# Patient Record
Sex: Male | Born: 1950 | Race: White | Hispanic: No | Marital: Married | Smoking: Former smoker
Health system: Southern US, Community
[De-identification: ages and names within clinical notes are randomized; demographics above are authoritative.]

## PROBLEM LIST (undated history)

## (undated) DIAGNOSIS — E785 Hyperlipidemia, unspecified: Secondary | ICD-10-CM

## (undated) DIAGNOSIS — I739 Peripheral vascular disease, unspecified: Secondary | ICD-10-CM

## (undated) DIAGNOSIS — M4802 Spinal stenosis, cervical region: Secondary | ICD-10-CM

## (undated) DIAGNOSIS — G709 Myoneural disorder, unspecified: Secondary | ICD-10-CM

## (undated) DIAGNOSIS — M549 Dorsalgia, unspecified: Secondary | ICD-10-CM

## (undated) DIAGNOSIS — R7301 Impaired fasting glucose: Secondary | ICD-10-CM

## (undated) DIAGNOSIS — I10 Essential (primary) hypertension: Secondary | ICD-10-CM

## (undated) DIAGNOSIS — R05 Cough: Secondary | ICD-10-CM

## (undated) DIAGNOSIS — G4733 Obstructive sleep apnea (adult) (pediatric): Secondary | ICD-10-CM

## (undated) DIAGNOSIS — C801 Malignant (primary) neoplasm, unspecified: Secondary | ICD-10-CM

## (undated) DIAGNOSIS — R972 Elevated prostate specific antigen [PSA]: Secondary | ICD-10-CM

## (undated) DIAGNOSIS — R059 Cough, unspecified: Secondary | ICD-10-CM

## (undated) DIAGNOSIS — M1990 Unspecified osteoarthritis, unspecified site: Secondary | ICD-10-CM

## (undated) DIAGNOSIS — F329 Major depressive disorder, single episode, unspecified: Secondary | ICD-10-CM

## (undated) DIAGNOSIS — E119 Type 2 diabetes mellitus without complications: Secondary | ICD-10-CM

## (undated) DIAGNOSIS — F32A Depression, unspecified: Secondary | ICD-10-CM

## (undated) DIAGNOSIS — J310 Chronic rhinitis: Secondary | ICD-10-CM

## (undated) HISTORY — DX: Cough, unspecified: R05.9

## (undated) HISTORY — DX: Cough: R05

## (undated) HISTORY — DX: Obstructive sleep apnea (adult) (pediatric): G47.33

## (undated) HISTORY — DX: Depression, unspecified: F32.A

## (undated) HISTORY — DX: Spinal stenosis, cervical region: M48.02

## (undated) HISTORY — DX: Impaired fasting glucose: R73.01

## (undated) HISTORY — PX: OTHER SURGICAL HISTORY: SHX169

## (undated) HISTORY — DX: Essential (primary) hypertension: I10

## (undated) HISTORY — DX: Hyperlipidemia, unspecified: E78.5

## (undated) HISTORY — PX: PROSTATE BIOPSY: SHX241

## (undated) HISTORY — DX: Chronic rhinitis: J31.0

## (undated) HISTORY — DX: Dorsalgia, unspecified: M54.9

## (undated) HISTORY — DX: Major depressive disorder, single episode, unspecified: F32.9

---
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
## 2004-10-16 DIAGNOSIS — G4733 Obstructive sleep apnea (adult) (pediatric): Secondary | ICD-10-CM

## 2004-10-16 HISTORY — DX: Obstructive sleep apnea (adult) (pediatric): G47.33

## 2004-12-20 ENCOUNTER — Ambulatory Visit (HOSPITAL_COMMUNITY): Admission: RE | Admit: 2004-12-20 | Discharge: 2004-12-20 | Payer: Self-pay | Admitting: Chiropractic Medicine

## 2007-10-17 DIAGNOSIS — M549 Dorsalgia, unspecified: Secondary | ICD-10-CM

## 2007-10-17 HISTORY — DX: Dorsalgia, unspecified: M54.9

## 2008-02-03 ENCOUNTER — Ambulatory Visit (HOSPITAL_COMMUNITY): Admission: RE | Admit: 2008-02-03 | Discharge: 2008-02-03 | Payer: Self-pay | Admitting: Chiropractic Medicine

## 2008-10-16 HISTORY — PX: OTHER SURGICAL HISTORY: SHX169

## 2009-02-12 ENCOUNTER — Ambulatory Visit (HOSPITAL_COMMUNITY): Admission: RE | Admit: 2009-02-12 | Discharge: 2009-02-12 | Payer: Self-pay | Admitting: Neurosurgery

## 2009-03-03 ENCOUNTER — Ambulatory Visit (HOSPITAL_COMMUNITY): Admission: RE | Admit: 2009-03-03 | Discharge: 2009-03-05 | Payer: Self-pay | Admitting: Neurosurgery

## 2010-01-31 ENCOUNTER — Encounter: Payer: Self-pay | Admitting: Cardiology

## 2010-02-02 ENCOUNTER — Ambulatory Visit: Payer: Self-pay | Admitting: Cardiology

## 2010-02-02 DIAGNOSIS — R079 Chest pain, unspecified: Secondary | ICD-10-CM | POA: Insufficient documentation

## 2010-02-16 ENCOUNTER — Encounter: Payer: Self-pay | Admitting: Cardiology

## 2010-02-22 ENCOUNTER — Telehealth (INDEPENDENT_AMBULATORY_CARE_PROVIDER_SITE_OTHER): Payer: Self-pay | Admitting: *Deleted

## 2010-02-23 ENCOUNTER — Ambulatory Visit: Payer: Self-pay | Admitting: Cardiology

## 2010-02-23 ENCOUNTER — Ambulatory Visit: Payer: Self-pay

## 2010-02-23 ENCOUNTER — Encounter (HOSPITAL_COMMUNITY): Admission: RE | Admit: 2010-02-23 | Discharge: 2010-04-15 | Payer: Self-pay | Admitting: Cardiology

## 2010-04-02 IMAGING — CT CT CERVICAL SPINE W/ CM
3 of 14 series · 9 of 33 positions shown, 10 images · IV contrast (omnipaque)
Comparison: None

CLINICAL DATA: Low back pain.  Leg pain.  Neck pain.  Arm pain.

MYELOGRAM CERVICAL AND LUMBAR
TECHNIQUE: Lumbar puncture was performed by Dr. Bac. Following
injection of intrathecal Omnipaque contrast, spine imaging in
multiple projections was performed using fluoroscopy.
Fluoroscopy Time: 2.9 minutes.
TECHNIQUE: CT imaging of the lumbar spine was performed after
intrathecal contrast administration.  Multiplanar CT image
reconstructions were also generated.
TECHNIQUE: CT imaging of the cervical spine was performed after
intrathecal contrast administration. Multiplanar CT image

[Series 5: 2mm axial soft tissue · axial · 0.29mm/px · z∈[-700,-444]mm · 3 of 128 slices shown, 4 images]
[im 1/128  soft-tissue]
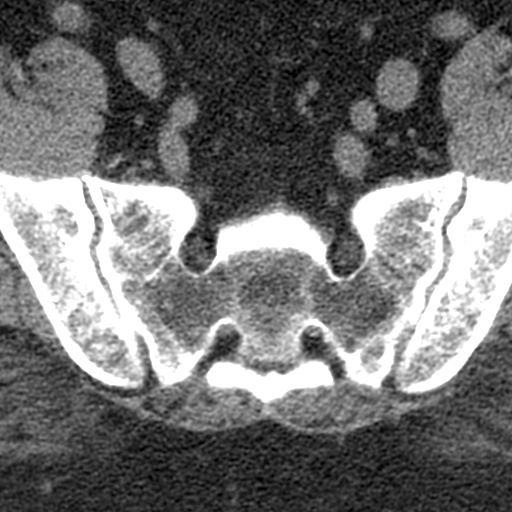
[im 1/128  bone]
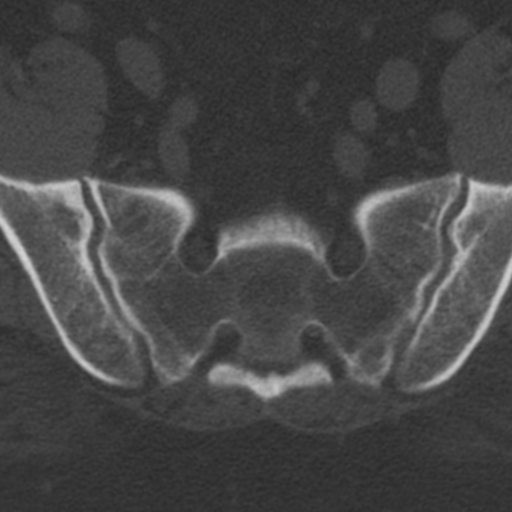
[im 64/128  bone]
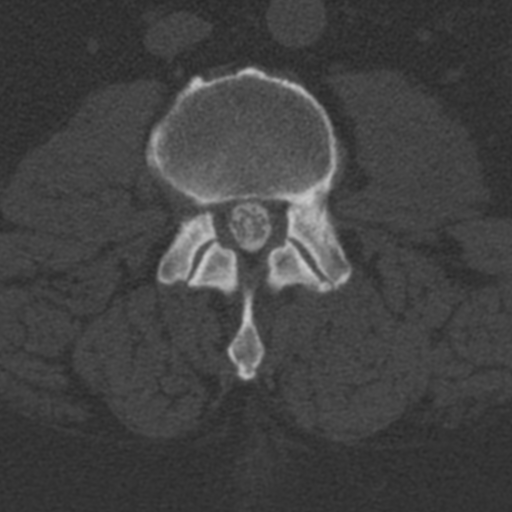
[im 128/128  bone]
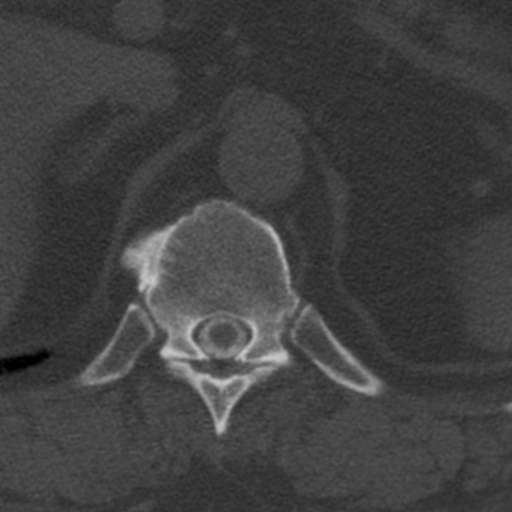

[Series 602: cor det c · coronal · 0.38mm/px · 1 of 59 slices shown]
[im 30/59  bone]
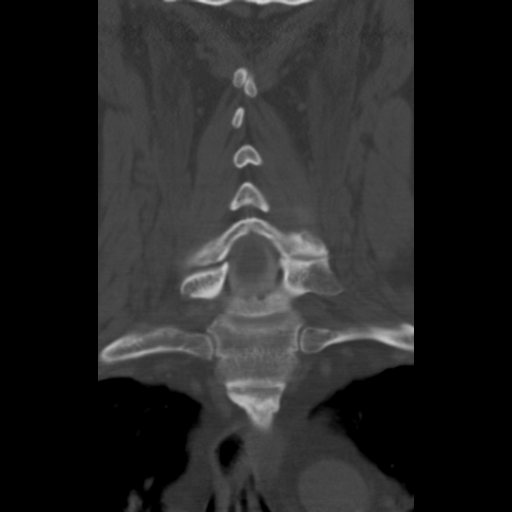

[Series 610: sag det l · sagittal · 0.50mm/px · 5 of 63 slices shown]
[im 11/63  bone]
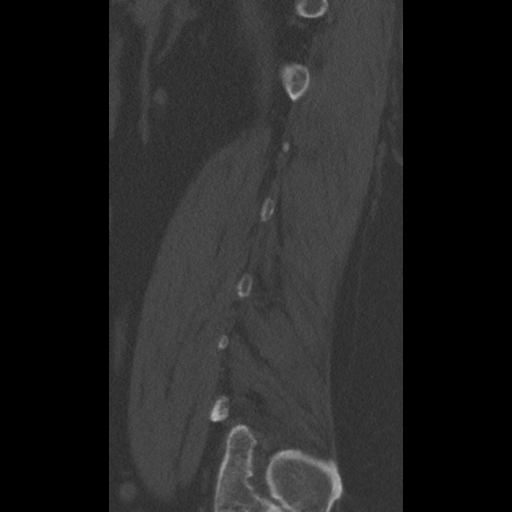
[im 21/63  bone]
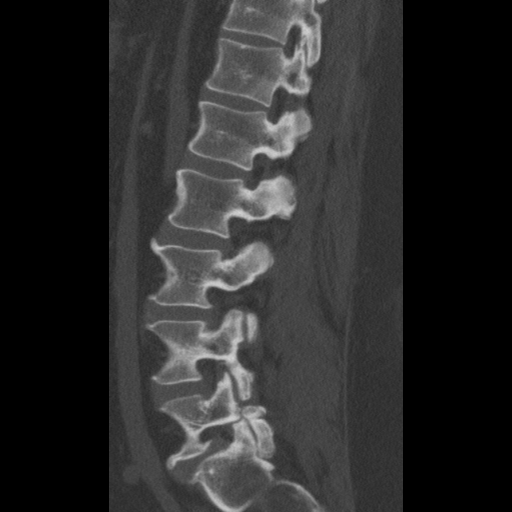
[im 32/63  bone]
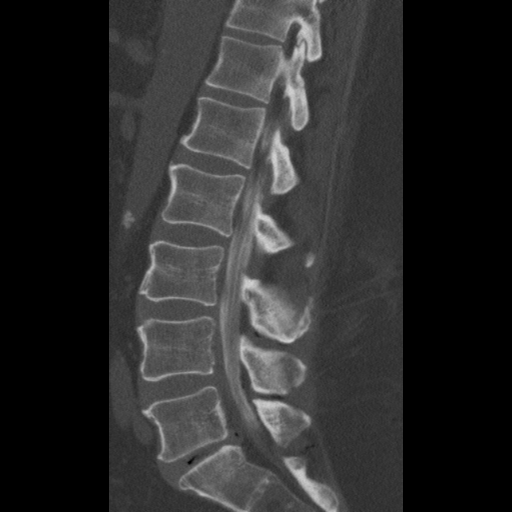
[im 42/63  bone]
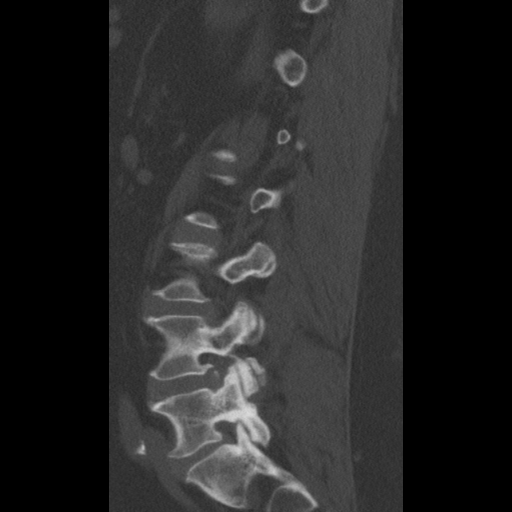
[im 52/63  bone]
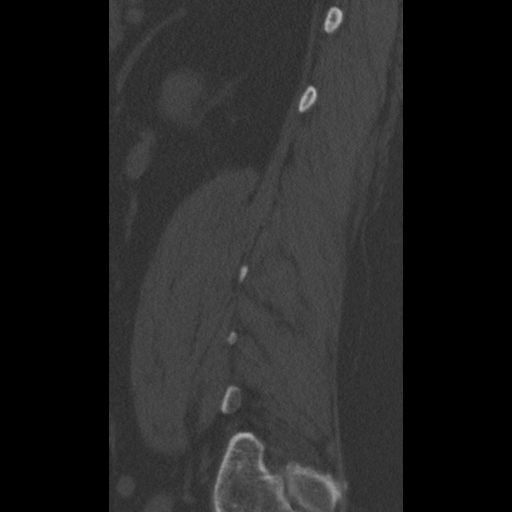

[9 of 33 positions shown; findings below may reference images not displayed]

FINDINGS: In the lumbar region, the thecal sac is somewhat
diminutive throughout.  There are small anterior extradural defects
at L4-5 and L5-S1 but no distinct focal neural compression
initially demonstrated.  On one of the delayed images, there
appears to be narrowing of the left lateral recess at L4-5.

In the cervical region, contrast opacity is limited.  Initially,
there is relative block to the passage of contrast at the C5-6
level, but with flexion of the head contrast dissipated rapidly.
See results of CT scan below.
IMPRESSION: Some narrowing of the left lateral recess at L4-5.

Degree of spinal stenosis at C5-6.  See below.

CT MYELOGRAPHY LUMBAR SPINE
FINDINGS: T11-12 through L2-3:  Normal.  No disc pathology.  No
canal and foraminal stenosis.

L3-4:  Mild bulging of the disc.  Mild facet and ligamentous
prominence.  No apparent compressive stenosis.

L4-5:  There is a left posterolateral to foraminal disc herniation.
There is facet arthropathy worse on the left than the right.  There
is stenosis of the intervertebral foramen on the left quite likely
to affect the L4 nerve root.  There is lateral recess narrowing
bilaterally, left more than right, that could effect either L5
nerve root, more likely the left.

L5-S1:  There is a unilateral pars defect on the right to.  There
is pars sclerosis on the left but no defect.  There is
anterolisthesis of 1 mm.  There is shallow broad-based protrusion
of disc material but no apparent effect upon the S1 nerve roots or
thecal sac.  There is mild foraminal narrowing, right more than
left.
IMPRESSION: L4-5:  Broad-based, left posterolateral predominant disc
herniation.  Facet arthropathy left worse than right.  Stenosis of
the foramen on the left that would seem likely compress the L4
nerve root.  Narrowing of the lateral recesses that could effect
either L5 nerve root, more likely the left.

L5-S1:  Unilateral pars defect on the right with 1 mm of
anterolisthesis.  Shallow broad-based disc protrusion.  Foraminal
narrowing, right more than left, could effect the L5 nerve roots.

CT MYELOGRAPHY CERVICAL SPINE
FINDINGS: The foramen magnum and C1-2 appear widely patent.

C2-3:  No disc pathology.  The canal and foramina appear widely
patent.  There is a small articulation between the spinous
processes that is probably not significant.

C3-4:  Minimal uncovertebral prominence and facet degeneration on
the left.  The canal is small but there is ample subarachnoid space
surrounding the cord.  Mild foraminal narrowing on the left could
effect the C4 nerve root.

C4-5:  Spondylosis with endplate osteophytes and shallow herniation
of disc material.  The ventral subarachnoid space is effaced.  The
cord is indented.  There is foraminal stenosis bilaterally, left
worse than right.  The facet on the left shows mild degeneration.

C5-6:  Spondylosis with endplate osteophytes that efface the
subarachnoid space and deform the cord.  Bilateral neural foraminal
stenosis.

C6-7:  Mild bulging of the disc.  The canal sufficiently patent.
No foraminal compromise.

C7-T1:  Detail is limited because of shoulder density.  The canal
appears widely patent.  No foraminal stenosis.
IMPRESSION: C4-5:  Spinal stenosis with endplate osteophytes and broad-based
herniation of disc material.  The ventral subarachnoid space is
effaced and the cord is deformed.  There is neural foraminal
stenosis, left worse than right.

C5-6:  Spinal stenosis because of end plate osteophytes.  The
subarachnoid space is effaced.  The cord is flattened.  There is
neural foraminal stenosis bilaterally.

## 2010-11-17 NOTE — Assessment & Plan Note (Signed)
Summary: Cardiology Nuclear Study  Nuclear Med Background Indications for Stress Test: Evaluation for Ischemia    History Comments: NO DOCUMENTED CAD; h/o OSA  Symptoms: Chest Tightness, DOE  Symptoms Comments: Tingling to the (L) arm with chest tightness. Last episode of CP:1 week ago.   Nuclear Pre-Procedure Cardiac Risk Factors: Family History - CAD, History of Smoking Caffeine/Decaff Intake: None NPO After: 7:00 PM Lungs: Clear.  O2 Sat 98% on RA. IV 0.9% NS with Angio Cath: 20g     IV Site: (L) AC IV Started by: Irean Hong RN Chest Size (in) 48     Height (in): 70.5 Weight (lb): 252 BMI: 35.78 Tech Comments: The patient has pinched nerve in back with tingling and numbness in feet, and (L) knee pain, unable to walk fast on treadmill per patient; changed to lexiscan myoview. Patsy Edwards,RN.  Nuclear Med Study 1 or 2 day study:  1 day     Stress Test Type:  Eugenie Birks Reading MD:  Willa Rough, MD     Referring MD:  Marca Ancona, MD Resting Radionuclide:  Technetium 58m Tetrofosmin     Resting Radionuclide Dose:  11 mCi  Stress Radionuclide:  Technetium 73m Tetrofosmin     Stress Radionuclide Dose:  33 mCi   Stress Protocol   Lexiscan: 0.4 mg   Stress Test Technologist:  [employer]a [employer] CMA-N     Nuclear Technologist:  Domenic Polite CNMT  Rest Procedure  Myocardial perfusion imaging was performed at rest 45 minutes following the intravenous administration of Myoview Technetium 74m Tetrofosmin.  Stress Procedure  The patient received IV Lexiscan 0.4 mg over 15-seconds.  Myoview injected at 30-seconds.  There were no significant changes with lexiscan.  He did c/o chest tightness.  Quantitative spect images were obtained after a 45 minute delay.  QPS Raw Data Images:  Normal; no motion artifact; normal heart/lung ratio. Stress Images:  There is normal uptake in all areas. Rest Images:  Normal homogeneous uptake in all areas of the myocardium. Subtraction (SDS):  No  evidence of ischemia. Transient Ischemic Dilatation:  .95  (Normal <1.22)  Lung/Heart Ratio:  .32  (Normal <0.45)  Quantitative Gated Spect Images QGS EDV:  93 ml QGS ESV:  36 ml QGS EF:  62 % QGS cine images:  Normal motion  Findings Normal nuclear study      Overall Impression  Exercise Capacity: Lexiscan BP Response: Normal blood pressure response. Clinical Symptoms: SOB ECG Impression: No significant ST segment change suggestive of ischemia. Overall Impression: Normal stress nuclear study.  Appended Document: Cardiology Nuclear Study normal study  Appended Document: Cardiology Nuclear Study discussed results with pt by telephone

## 2010-11-17 NOTE — Assessment & Plan Note (Signed)
Summary: np6/chest tightness/ cough/ sob/jml   Primary Provider:  Dr. Eloise Harman  CC:  chest pain and cough and some Headaches off and on.  .  History of Present Illness: 60 yo presents for evaluation of chest pain.  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  This past "Sunday, he woke up with a sinus-type headache.  After supper on Sunday (ate a pimento cheese sandwich), he developed tightness in his chest with tingling in his left arm.  This made him very anxious.  He laid down on the couch and the tightness resolved in about 1/2 hour.  On Monday morning, he woke up wtih milder chest tightness.  On Monday, this was associated with cough, congestion, and post-nasal drip and resolved during the morning.  No chest tightness since that time.  He had had no prior chest tightness.  CXR at Dr. Paterson's office was negative.  No cardiac history or stress test.  Father had an MI at 62.  BP is ok today and patient says that his lipids have been ok.  Patient still has a nagging cough.  He was seen at Dr. Paterson's office with the most likely diagnosis thought to be allergies.  He was referred here for cardiac evaluation.    ECG: NSR, possible left atrial enlargement, right axis deviation  Current Medications (verified): 1)  Allegra 180 Mg Tabs (Fexofenadine Hcl) .... Once Daily For Allergies 2)  Ecotrin 325 Mg Tbec (Aspirin) .... Take One Tablet Once Daily--Pt Has Not Started This Yet  Allergies (verified): No Known Drug Allergies  Past History:  Past Medical History: 1. 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 Neuropathy 5. Mild obesity  Family History: Father with MI at 62  Social History: Quit smoking in 1981. Works as an accounts manager (stressful job).   Review of Systems       All systems reviewed and negative except as per HPI.   Vital Signs:  Patient profile:   59 year old male Height:      71  inches Weight:      253 pounds BMI:     35" .41 Pulse rate:   62 / minute Pulse rhythm:   regular BP sitting:   136 / 80  (left arm) Cuff size:   large  Vitals Entered By: Judithe Modest CMA (February 02, 2010 2:27 PM)  Physical Exam  General:  Well developed, well nourished, in no acute distress. Mildly obese.  Head:  normocephalic and atraumatic Nose:  no deformity, discharge, inflammation, or lesions Mouth:  Teeth, gums and palate normal. Oral mucosa normal. Neck:  Neck supple, no JVD. No masses, thyromegaly or abnormal cervical nodes. Lungs:  Clear bilaterally to auscultation and percussion. Heart:  Non-displaced PMI, chest non-tender; regular rate and rhythm, S1, S2 without murmurs, rubs or gallops. Carotid upstroke normal, no bruit.  Pedals normal pulses. No edema, no varicosities. Abdomen:  Bowel sounds positive; abdomen soft and non-tender without masses, organomegaly, or hernias noted. No hepatosplenomegaly. Msk:  Back normal, normal gait. Muscle strength and tone normal. Extremities:  No clubbing or cyanosis. Neurologic:  Alert and oriented x 3. Skin:  Intact without lesions or rashes. Psych:  Normal affect.   Impression & Recommendations:  Problem # 1:  CHEST PAIN (ICD-786.50) Prolonged but atypical chest pain. It is possible that the episode after dinner was related to GERD.  Alternatively, he could have an acute bronchitis with cough and even some  bronchospasm explaining his symptoms.  He is anxious as his father had an MI at 78 (he is 61). He has not had a cardiac workup in the past.  Given the significant symptoms and family history, I think that risk stratification with an ETT-myoview would be reasonable.  He should continue aspirin.   Other Orders: EKG w/ Interpretation (93000) Nuclear Stress Test (Nuc Stress Test)  Patient Instructions: 1)  Your physician has requested that you have an exercise stress myoview.  For further information please visit  [email or web address]  Please follow instruction sheet, as given. 2)  Your physician recommends that you schedule a follow-up appointment in:  as needed if testing is normal.

## 2010-11-17 NOTE — Progress Notes (Signed)
Summary: Nuclear Pre-Procedure  Phone Note Outgoing Call   Call placed by: Milana Na, EMT-P,  Feb 22, 2010 1:32 PM Summary of Call: Left message with information on Myoview Information Sheet (see scanned document for details).      Nuclear Med Background Indications for Stress Test: Evaluation for Ischemia     Symptoms: Chest Pain, Chest Tightness  Symptoms Comments: Tingling to the (L) arm with chest tightness   Nuclear Pre-Procedure Cardiac Risk Factors: History of Smoking Height (in): 71  Nuclear Med Study Referring MD:  D.McLean

## 2010-11-17 NOTE — Cardiovascular Report (Signed)
Summary: Outpatient Coinsurance Notice   Outpatient Coinsurance Notice   Imported By: Roderic Ovens 02/28/2010 12:39:01  _____________________________________________________________________  External Attachment:    Type:   Image     Comment:   External Document

## 2010-11-17 NOTE — Consult Note (Signed)
Summary: [facility]   Imported By: Marylou Mccoy 03/30/2010 15:12:48  _____________________________________________________________________  External Attachment:    Type:   Image     Comment:   External Document

## 2010-11-18 NOTE — Letter (Signed)
Summary: Company secretary   Imported By: Roderic Ovens 03/25/2010 12:10:59  _____________________________________________________________________  External Attachment:    Type:   Image     Comment:   External Document

## 2011-01-24 LAB — CBC
HCT: 43.7 % (ref 39.0–52.0)
MCHC: 34.6 g/dL (ref 30.0–36.0)
MCV: 85.1 fL (ref 78.0–100.0)
Platelets: 248 10*3/uL (ref 150–400)

## 2011-02-28 NOTE — H&P (Signed)
Jeffrey Banks, Jeffrey Banks                  ACCOUNT NO.:  [id number]   MEDICAL RECORD NO.:  [id number]          PATIENT TYPE:  OIB   LOCATION:  3012                         FACILITY:  [facility]   PHYSICIAN:  Hilda Lias, M.D.   DATE OF BIRTH:  [birth year]   DATE OF ADMISSION:  03/03/2009  DATE OF DISCHARGE:                              HISTORY & PHYSICAL   HISTORY OF PRESENT ILLNESS:  Jeffrey Banks is a gentleman who had been  complaining of neck pain with radiation to both upper extremity.  He  fell back from his lawn tractor.  At the time, a year ago, he was able  to move.  He was having burning sensation in the upper and lower  extremity.  He was seen by me.  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  He was advised surgery but he decided  to wait longer.  He knew about the risks.   PAST MEDICAL HISTORY:  Tonsillectomy.   FAMILY HISTORY:  Negative.   SOCIAL HISTORY:  Negative.   REVIEW OF SYSTEMS:  Positive burning sensation in both legs.   PHYSICAL EXAMINATION:  VITAL SIGNS:  He is at 5 feet 10 inches and 250  pounds.  HEAD, EARS, NOSE, AND THROAT:  Normal.  NECK:  He is able to flex __________ discomfort onto both shoulders.  LUNGS:  Clear.  HEART:  Normal.  ABDOMEN:  Normal.  EXTREMITIES:  Normal pulses.  NEURO:  He has weakness of the biceps and the deltoid.  He has  hyperreflexia with Babinski on the right side.  He has a difficulty  walking straight line.   IMAGING:  Cervical spine x-ray shows stenosis at level of C4-C5 and C5-  C6.  There seems to be some changes in the spinal cord itself.   IMPRESSION:  1. C4-C5, C5-C6 stenosis with cervical myelopathy.  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  3. Morbid obesity.   RECOMMENDATIONS:  The patient is being admitted for surgery.  The  procedure will be a two-level anterior cervical diskectomy.  He knows  about the risks such as infection, CSF leak, worsening of pain, no  improvement whatsoever,  paralysis, and need for further surgery.           ______________________________  Hilda Lias, M.D.     EB/MEDQ  D:  03/03/2009  T:  03/03/2009  Job:  [id number]

## 2011-02-28 NOTE — Op Note (Signed)
Jeffrey Banks, Jeffrey Banks                  ACCOUNT NO.:  [id number]   MEDICAL RECORD NO.:  [id number]          PATIENT TYPE:  OIB   LOCATION:  3012                         FACILITY:  [facility]   PHYSICIAN:  Hilda Lias, M.D.   DATE OF BIRTH:  01/12/1951   DATE OF PROCEDURE:  03/03/2009  DATE OF DISCHARGE:                               OPERATIVE REPORT   PREOPERATIVE DIAGNOSIS:  C4-C5 and C5-C6 stenosis with cervical  myelopathy and morbid obesity.   POSTOPERATIVE DIAGNOSES:  C4-C5 and C5-C6 stenosis with cervical  myelopathy and morbid obesity.   PROCEDURE:  C4-5 and C5-6 diskectomy, decompression of spinal cord,  interbody fusion with allograft, autograft plate, and microscope.   SURGEON:  Hilda Lias, MD   ASSISTANT:  Danae Orleans. Venetia Maxon, MD   CLINICAL HISTORY:  Mr. Featherston is a gentleman who a year ago, fell and he  was paralyzed for several minutes.  He was seen in my office and he was  advised surgery.  He declined and he came back almost a year later  complaining of worse in the pain with weakness prone with balance.  X-  rays show severe stenosis at the level of C4-5 and C5-6.  Surgery was  advised.  The risks were explained to him including the possibility of  all the complications secondary to his morbid obesity.  He has a history  of sleep apnea.   PROCEDURE:  The patient was taken to the OR and after intubation, the  left side of the neck was cleaned with DuraPrep.  No hyperextension of  the neck was done and the surgery was done in a neutral position because  he has a stenosis.  Then, a transverse incision was made through the  skin through a thick adipose tissue and platysma down to cervical area.  The patient had 2 areas of large osteophyte and x-rays showed the upper  needle was at the level of C4-5.  Then with the drill, we removed the  osteophyte at the level of C4-5 and C5-6.  We started at the level of C5-  6, that area was quite stenotic up to the point that the disk  itself was  half calcified.  We had to remove the disk using the drill.  Finally, we  ended up in the posterior ligament, which was calcified.  An incision  was made and decompression of the spinal cord as well as the nerve was  achieved.  We had good decompression at that area.  At the level of C4-  5, we had the same finding.  Again, in the end we had good decompression  of that area.  During procedure, we were thinking about doing  corpectomy, but we were able to achieve good decompression at both  levels.  Then, an allograft of 7 mm with autograft inside was inserted  between L4-5 at about 9-mm between L5-6.  Then a plate using 6 screw was  done.  The x-ray showed the upper plate as well as the bone graft was in  good position.  We were unable to see  below.  Although, we achieved good  decompression, because of his obesity, we decided to leave a drain in  the pre-cervical area.  The wound was closed with Vicryl and Steri-  Strips.           ______________________________  Hilda Lias, M.D.     EB/MEDQ  D:  03/03/2009  T:  03/04/2009  Job:  [id number]

## 2012-10-08 ENCOUNTER — Other Ambulatory Visit (HOSPITAL_COMMUNITY): Payer: Self-pay | Admitting: Family Medicine

## 2012-10-08 ENCOUNTER — Ambulatory Visit (HOSPITAL_COMMUNITY)
Admission: RE | Admit: 2012-10-08 | Discharge: 2012-10-08 | Disposition: A | Discharge status: Home / Self Care | Payer: BC Managed Care – PPO | Source: Ambulatory Visit | Attending: Family Medicine | Admitting: Family Medicine

## 2012-10-08 DIAGNOSIS — S0093XA Contusion of unspecified part of head, initial encounter: Secondary | ICD-10-CM

## 2012-10-08 DIAGNOSIS — S0083XA Contusion of other part of head, initial encounter: Secondary | ICD-10-CM | POA: Insufficient documentation

## 2012-10-08 DIAGNOSIS — S0003XA Contusion of scalp, initial encounter: Secondary | ICD-10-CM | POA: Insufficient documentation

## 2012-10-08 DIAGNOSIS — R42 Dizziness and giddiness: Secondary | ICD-10-CM

## 2012-10-08 DIAGNOSIS — X58XXXA Exposure to other specified factors, initial encounter: Secondary | ICD-10-CM | POA: Insufficient documentation

## 2012-10-16 DIAGNOSIS — R7301 Impaired fasting glucose: Secondary | ICD-10-CM

## 2012-10-16 HISTORY — DX: Impaired fasting glucose: R73.01

## 2014-10-05 ENCOUNTER — Encounter: Payer: Self-pay | Admitting: Neurology

## 2014-10-05 ENCOUNTER — Ambulatory Visit (INDEPENDENT_AMBULATORY_CARE_PROVIDER_SITE_OTHER): Payer: 59 | Admitting: Neurology

## 2014-10-05 VITALS — BP 137/70 | HR 69 | Temp 98.4°F | Ht 70.0 in | Wt 252.0 lb

## 2014-10-05 DIAGNOSIS — Z9989 Dependence on other enabling machines and devices: Secondary | ICD-10-CM

## 2014-10-05 DIAGNOSIS — G4733 Obstructive sleep apnea (adult) (pediatric): Secondary | ICD-10-CM

## 2014-10-05 DIAGNOSIS — E669 Obesity, unspecified: Secondary | ICD-10-CM

## 2014-10-05 NOTE — Patient Instructions (Addendum)
If you have new symptoms with your sleep apnea, such as recurrence of snoring, sleepiness in the daytime or new morning headaches, or if your CPAP is defective, we will do another sleep study as your last study is nearly 65 years old.  I will see you back as needed.   Please continue using your CPAP regularly. While your insurance requires that you use CPAP at least 4 hours each night on 70% of the nights, I recommend, that you not skip any nights and use it throughout the night if you can. Getting used to CPAP and staying with the treatment long term does take time and patience and discipline. Untreated obstructive sleep apnea when it is moderate to severe can have an adverse impact on cardiovascular health and raise her risk for heart disease, arrhythmias, hypertension, congestive heart failure, stroke and diabetes. Untreated obstructive sleep apnea causes sleep disruption, nonrestorative sleep, and sleep deprivation. This can have an impact on your day to day functioning and cause daytime sleepiness and impairment of cognitive function, memory loss, mood disturbance, and problems focussing. Using CPAP regularly can improve these symptoms.

## 2014-10-05 NOTE — Progress Notes (Signed)
Subjective:    Patient ID: Jeffrey Banks is a 63 y.o. male.  HPI     Star Age, MD, PhD [facility] [address] [place], [place] [address]  Dear Dr. Philip Aspen,    I saw your patient, Jeffrey Banks, upon your kind request in my neurologic clinic today for initial consultation of his sleep disorder, in particular evaluation of his prior diagnosis of OSA. The patient in unaccompanied today. As you know, Jeffrey Banks is a 63 year old right-handed gentleman with an underlying medical history of hyperlipidemia, rhinitis, depression, low back pain, hypertension, impaired fasting glucose, and obesity, who has a diagnosis of severe obstructive sleep apnea since 2006. He has been on CPAP therapy at 9 cm of water pressure and reports compliance with the machine. However, his equipment is almost 65 years old and he has never been reevaluated since his original diagnosis. Of note, he had a split-night sleep study at the [facility] on 03/23/2005 and I reviewed the report. Sleep efficiency was 59% for the baseline portion of the sleep study. His AHI was 66 per hour. Average oxygen saturation was 94%, nadir was 87%. He was observed to snore loudly. He was titrated on CPAP. He was at the time not tolerant of CPAP above a pressure of 4 cm. It was recommended that he start AutoPap therapy at home.  I was able to review compliance data and the patient has been compliant with treatment. His residual AHI fluctuates quite a bit however. It averages a little bit above 5 per hour. Leak at times can be high. Of note, he has a full beard. He uses nasal pillows and is satisfied with him. He gets his supplies online. His machine is almost 65 years old but works fine for him. He's indicating full compliance and good results since he started using CPAP. He has no particular sleep related complaints. His bedtime is 10 PM and he falls asleep fairly quickly. His wake  time is 5 AM. On Sundays he takes in. He has no morning headaches or nocturia. He denies restless leg symptoms. He is not known to twitch in his sleep. His weight is about the same since his sleep study some 9 years ago. He has no family history of obstructive sleep apnea. He drinks about 3 cups of coffee per day and no sodas. He quit smoking over 30 years ago and does not drink any alcohol. He had neck surgery some 7 years ago and will require left knee replacement surgery soon.  His Past Medical History Is Significant For: Past Medical History  Diagnosis Date  . Rhinitis   . Cough   . Depression   . OSA (obstructive sleep apnea) 2006    on CPAP,at 9cm  . Hyperlipidemia   . Hypertension   . Impaired fasting glucose 2014  . Stenosis of cervical spine   . Back pain 2009    His Past Surgical History Is Significant For: Past Surgical History  Procedure Laterality Date  . Lamenectomy  2009  . Tonsillectomy  1970    His Family History Is Significant For: Family History  Problem Relation Age of Onset  . Heart failure Father   . High Cholesterol Mother     His Social History Is Significant For: History   Social History  . Marital Status: Married    Spouse Name: Jeffrey Banks    Number of Children: 2  . Years of Education: college  Occupational History  .      [employer]   Social History Main Topics  . Smoking status: Never Smoker   . Smokeless tobacco: Never Used  . Alcohol Use: No  . Drug Use: No  . Sexual Activity: None   Other Topics Concern  . None   Social History Narrative   Consumes 2-3 cups of caffeine daily    His Allergies Are:  No Known Allergies:   His Current Medications Are:  Outpatient Encounter Prescriptions as of 10/05/2014  Medication Sig  . Fexofenadine HCl (ALLEGRA PO) Take by mouth daily.  . [DISCONTINUED] HYDROcodone-acetaminophen (NORCO/VICODIN) 5-325 MG per tablet   :  Review of Systems:  Out of a complete 14 point review of  systems, all are reviewed and negative with the exception of these symptoms as listed below:   Review of Systems  Allergic/Immunologic: Positive for environmental allergies.  Neurological:       Snoring    Objective:  Neurologic Exam  Physical Exam Physical Examination:   Filed Vitals:   10/05/14 0808  BP: 137/70  Pulse: 69  Temp: 98.4 F (36.9 C)    General Examination: The patient is a very pleasant 63 y.o. male in no acute distress. He appears well-developed and well-nourished and very well groomed.   HEENT: Normocephalic, atraumatic, pupils are equal, round and reactive to light and accommodation. Funduscopic exam is normal with sharp disc margins noted. Extraocular tracking is good without limitation to gaze excursion or nystagmus noted. Normal smooth pursuit is noted. Hearing is grossly intact. Tympanic membranes are clear bilaterally. Face is symmetric with normal facial animation and normal facial sensation. Speech is clear with no dysarthria noted. There is no hypophonia. There is no lip, neck/head, jaw or voice tremor. Neck is supple with full range of passive and active motion. There are no carotid bruits on auscultation. Oropharynx exam reveals: mild mouth dryness, good dental hygiene and mild airway crowding, due to narrow airway entry and slightly redundant soft palate. Mallampati is class I. Tongue protrudes centrally and palate elevates symmetrically. Tonsils are absent is 19 and 1/8 inches.  Chest: Clear to auscultation without wheezing, rhonchi or crackles noted.  Heart: S1+S2+0, regular and normal without murmurs, rubs or gallops noted.   Abdomen: Soft, non-tender and non-distended with normal bowel sounds appreciated on auscultation.  Extremities: There is no pitting edema in the distal lower extremities bilaterally. Pedal pulses are intact.  Skin: Warm and dry without trophic changes noted. There are no varicose veins.  Musculoskeletal: exam reveals no obvious  joint deformities, tenderness or joint swelling or erythema, with the exception of left knee tenderness.  Neurologically:  Mental status: The patient is awake, alert and oriented in all 4 spheres. His immediate and remote memory, attention, language skills and fund of knowledge are appropriate. There is no evidence of aphasia, agnosia, apraxia or anomia. Speech is clear with normal prosody and enunciation. Thought process is linear. Mood is normal and affect is normal.  Cranial nerves II - XII are as described above under HEENT exam. In addition: shoulder shrug is normal with equal shoulder height noted. Motor exam: Normal bulk, strength and tone is noted. There is no drift, tremor or rebound. Romberg is negative. Reflexes are 2+ throughout. Babinski: Toes are flexor bilaterally. Fine motor skills and coordination: intact with normal finger taps, normal hand movements, normal rapid alternating patting, normal foot taps and normal foot agility.  Cerebellar testing: No dysmetria or intention tremor on finger to  nose testing. Heel to shin is unremarkable bilaterally. There is no truncal or gait ataxia.  Sensory exam: intact to light touch, pinprick, vibration, temperature sense in the upper and lower extremities.  Gait, station and balance: He stands easily. No veering to one side is noted. No leaning to one side is noted. Posture is age-appropriate and stance is narrow based. Gait shows normal stride length and normal pace. No problems turning are noted. He turns en bloc. Tandem walk is difficult for him secondary to left knee pain.   Assessment and Plan:   In summary, Jeffrey Banks is a very pleasant 63 y.o.-year old male with an underlying medical history of hyperlipidemia, rhinitis, depression, low back pain, hypertension, impaired fasting glucose, and obesity, who has a diagnosis of severe obstructive sleep apnea since 2006.  he has been compliant with treatment. He has an older machine but it has been  working well for him. He has no new sleep related complaints. His physical exam is nonfocal. He is advised to try to continue to work on weight loss. His weight was about the same when he had his sleep study in 2006. I did review limited compliance data that we were able to get off of his machine. He certainly does appear to be fully compliant. Leak and AHI seems to fluctuate. This may be in part secondary to his full beard. He has been using nasal pillows and gets his supplies online. Today I suggested that we consider a new sleep study if he has a margins of new sleep related complaints such as reoccurrence of snoring and daytime somnolence, or morning headaches or significant nocturia, or if his machine becomes defective. I will see him back as needed at this time.  he understands the importance of being compliant with CPAP therapy. He has taken his machine on his trips as well. He likes to go hunting with his friends and has always taken his machine.  I answered all his questions today and the patient was in agreement.   Thank you very much for allowing me to participate in the care of this nice patient. If I can be of any further assistance to you please do not hesitate to call me at [phone].  Sincerely,   Star Age, MD, PhD

## 2014-11-20 DIAGNOSIS — R0789 Other chest pain: Secondary | ICD-10-CM | POA: Diagnosis not present

## 2014-11-20 DIAGNOSIS — Z01810 Encounter for preprocedural cardiovascular examination: Secondary | ICD-10-CM | POA: Diagnosis not present

## 2014-12-17 DIAGNOSIS — R7301 Impaired fasting glucose: Secondary | ICD-10-CM | POA: Diagnosis not present

## 2014-12-17 DIAGNOSIS — I10 Essential (primary) hypertension: Secondary | ICD-10-CM | POA: Diagnosis not present

## 2014-12-17 DIAGNOSIS — Z6836 Body mass index (BMI) 36.0-36.9, adult: Secondary | ICD-10-CM | POA: Diagnosis not present

## 2014-12-17 DIAGNOSIS — M545 Low back pain: Secondary | ICD-10-CM | POA: Diagnosis not present

## 2014-12-17 DIAGNOSIS — E669 Obesity, unspecified: Secondary | ICD-10-CM | POA: Diagnosis not present

## 2014-12-17 DIAGNOSIS — G4733 Obstructive sleep apnea (adult) (pediatric): Secondary | ICD-10-CM | POA: Diagnosis not present

## 2014-12-17 DIAGNOSIS — E785 Hyperlipidemia, unspecified: Secondary | ICD-10-CM | POA: Diagnosis not present

## 2015-02-08 DIAGNOSIS — M1712 Unilateral primary osteoarthritis, left knee: Secondary | ICD-10-CM | POA: Diagnosis not present

## 2015-02-23 ENCOUNTER — Ambulatory Visit: Payer: Self-pay | Admitting: Orthopedic Surgery

## 2015-04-14 DIAGNOSIS — M1712 Unilateral primary osteoarthritis, left knee: Secondary | ICD-10-CM | POA: Diagnosis not present

## 2015-04-26 NOTE — Patient Instructions (Addendum)
Jeffrey Banks  04/26/2015   Your procedure is scheduled on:    05/06/2015    Report to [facility] Main  Entrance take [place]  elevators to 3rd floor to  [place] at       [place] AM.  Call this number if you have problems the morning of surgery [phone]   Remember: ONLY 1 PERSON MAY GO WITH YOU TO SHORT STAY TO GET  READY MORNING OF [place].  Do not eat food or drink liquids :After Midnight.     Take these medicines the morning of surgery with A SIP OF WATER: Allegra                                You may not have any metal on your body including hair pins and              piercings  Do not wear jewelry, , lotions, powders or perfumes, deodorant                         Men may shave face and neck.   Do not bring valuables to the hospital. [place].  Contacts, dentures or bridgework may not be worn into surgery.  Leave suitcase in the car. After surgery it may be brought to your room.       Special Instructions: coughing and deep breathing exercises, leg exercises               Please read over the following fact sheets you were given: _____________________________________________________________________             [facility] - Preparing for Surgery Before surgery, you can play an important role.  Because skin is not sterile, your skin needs to be as free of germs as possible.  You can reduce the number of germs on your skin by washing with CHG (chlorahexidine gluconate) soap before surgery.  CHG is an antiseptic cleaner which kills germs and bonds with the skin to continue killing germs even after washing. Please DO NOT use if you have an allergy to CHG or antibacterial soaps.  If your skin becomes reddened/irritated stop using the CHG and inform your nurse when you arrive at Short Stay. Do not shave (including legs and underarms) for at least 48 hours prior to the first CHG shower.  You may  shave your face/neck. Please follow these instructions carefully:  1.  Shower with CHG Soap the night before surgery and the  morning of Surgery.  2.  If you choose to wash your hair, wash your hair first as usual with your  normal  shampoo.  3.  After you shampoo, rinse your hair and body thoroughly to remove the  shampoo.                           4.  Use CHG as you would any other liquid soap.  You can apply chg directly  to the skin and wash                       Gently with a scrungie or clean washcloth.  5.  Apply the CHG Soap to your body ONLY FROM THE NECK DOWN.   Do not use on face/ open                           Wound or open sores. Avoid contact with eyes, ears mouth and genitals (private parts).                       Wash face,  Genitals (private parts) with your normal soap.             6.  Wash thoroughly, paying special attention to the area where your surgery  will be performed.  7.  Thoroughly rinse your body with warm water from the neck down.  8.  DO NOT shower/wash with your normal soap after using and rinsing off  the CHG Soap.                9.  Pat yourself dry with a clean towel.            10.  Wear clean pajamas.            11.  Place clean sheets on your bed the night of your first shower and do not  sleep with pets. Day of Surgery : Do not apply any lotions/deodorants the morning of surgery.  Please wear clean clothes to the hospital/surgery center.  FAILURE TO FOLLOW THESE INSTRUCTIONS MAY RESULT IN THE CANCELLATION OF YOUR SURGERY PATIENT SIGNATURE_________________________________  NURSE SIGNATURE__________________________________  ________________________________________________________________________  WHAT IS A BLOOD TRANSFUSION? Blood Transfusion Information  A transfusion is the replacement of blood or some of its parts. Blood is made up of multiple cells which provide different functions.  Red blood cells carry oxygen and are used for blood loss  replacement.  White blood cells fight against infection.  Platelets control bleeding.  Plasma helps clot blood.  Other blood products are available for specialized needs, such as hemophilia or other clotting disorders. BEFORE THE TRANSFUSION  Who gives blood for transfusions?   Healthy volunteers who are fully evaluated to make sure their blood is safe. This is blood bank blood. Transfusion therapy is the safest it has ever been in the practice of medicine. Before blood is taken from a donor, a complete history is taken to make sure that person has no history of diseases nor engages in risky social behavior (examples are intravenous drug use or sexual activity with multiple partners). The donor's travel history is screened to minimize risk of transmitting infections, such as malaria. The donated blood is tested for signs of infectious diseases, such as HIV and hepatitis. The blood is then tested to be sure it is compatible with you in order to minimize the chance of a transfusion reaction. If you or a relative donates blood, this is often done in anticipation of surgery and is not appropriate for emergency situations. It takes many days to process the donated blood. RISKS AND COMPLICATIONS Although transfusion therapy is very safe and saves many lives, the main dangers of transfusion include:  1. Getting an infectious disease. 2. Developing a transfusion reaction. This is an allergic reaction to something in the blood you were given. Every precaution is taken to prevent this. The decision to have a blood transfusion has been considered carefully by your caregiver before blood is given. Blood is not given unless the benefits outweigh the risks. AFTER THE TRANSFUSION  Right after receiving a  blood transfusion, you will usually feel much better and more energetic. This is especially true if your red blood cells have gotten low (anemic). The transfusion raises the level of the red blood cells which  carry oxygen, and this usually causes an energy increase.  The nurse administering the transfusion will monitor you carefully for complications. HOME CARE INSTRUCTIONS  No special instructions are needed after a transfusion. You may find your energy is better. Speak with your caregiver about any limitations on activity for underlying diseases you may have. SEEK MEDICAL CARE IF:   Your condition is not improving after your transfusion.  You develop redness or irritation at the intravenous (IV) site. SEEK IMMEDIATE MEDICAL CARE IF:  Any of the following symptoms occur over the next 12 hours:  Shaking chills.  You have a temperature by mouth above 102 F (38.9 C), not controlled by medicine.  Chest, back, or muscle pain.  People around you feel you are not acting correctly or are confused.  Shortness of breath or difficulty breathing.  Dizziness and fainting.  You get a rash or develop hives.  You have a decrease in urine output.  Your urine turns a dark color or changes to pink, red, or brown. Any of the following symptoms occur over the next 10 days:  You have a temperature by mouth above 102 F (38.9 C), not controlled by medicine.  Shortness of breath.  Weakness after normal activity.  The white part of the eye turns yellow (jaundice).  You have a decrease in the amount of urine or are urinating less often.  Your urine turns a dark color or changes to pink, red, or brown. Document Released: 09/29/2000 Document Revised: 12/25/2011 Document Reviewed: 05/18/2008 ExitCare Patient Information 2014 [place].  _______________________________________________________________________  Incentive Spirometer  An incentive spirometer is a tool that can help keep your lungs clear and active. This tool measures how well you are filling your lungs with each breath. Taking long deep breaths may help reverse or decrease the chance of developing breathing (pulmonary) problems  (especially infection) following:  A long period of time when you are unable to move or be active. BEFORE THE PROCEDURE   If the spirometer includes an indicator to show your best effort, your nurse or respiratory therapist will set it to a desired goal.  If possible, sit up straight or lean slightly forward. Try not to slouch.  Hold the incentive spirometer in an upright position. INSTRUCTIONS FOR USE  3. Sit on the edge of your bed if possible, or sit up as far as you can in bed or on a chair. 4. Hold the incentive spirometer in an upright position. 5. Breathe out normally. 6. Place the mouthpiece in your mouth and seal your lips tightly around it. 7. Breathe in slowly and as deeply as possible, raising the piston or the ball toward the top of the column. 8. Hold your breath for 3-5 seconds or for as long as possible. Allow the piston or ball to fall to the bottom of the column. 9. Remove the mouthpiece from your mouth and breathe out normally. 10. Rest for a few seconds and repeat Steps 1 through 7 at least 10 times every 1-2 hours when you are awake. Take your time and take a few normal breaths between deep breaths. 11. The spirometer may include an indicator to show your best effort. Use the indicator as a goal to work toward during each repetition. 12. After each set of 10  deep breaths, practice coughing to be sure your lungs are clear. If you have an incision (the cut made at the time of surgery), support your incision when coughing by placing a pillow or rolled up towels firmly against it. Once you are able to get out of bed, walk around indoors and cough well. You may stop using the incentive spirometer when instructed by your caregiver.  RISKS AND COMPLICATIONS  Take your time so you do not get dizzy or light-headed.  If you are in pain, you may need to take or ask for pain medication before doing incentive spirometry. It is harder to take a deep breath if you are having  pain. AFTER USE  Rest and breathe slowly and easily.  It can be helpful to keep track of a log of your progress. Your caregiver can provide you with a simple table to help with this. If you are using the spirometer at home, follow these instructions: [place] IF:   You are having difficultly using the spirometer.  You have trouble using the spirometer as often as instructed.  Your pain medication is not giving enough relief while using the spirometer.  You develop fever of 100.5 F (38.1 C) or higher. SEEK IMMEDIATE MEDICAL CARE IF:   You cough up bloody sputum that had not been present before.  You develop fever of 102 F (38.9 C) or greater.  You develop worsening pain at or near the incision site. MAKE SURE YOU:   Understand these instructions.  Will watch your condition.  Will get help right away if you are not doing well or get worse. Document Released: 02/12/2007 Document Revised: 12/25/2011 Document Reviewed: 04/15/2007 [facility] Patient Information 2014 [place], [place].   ________________________________________________________________________

## 2015-04-27 ENCOUNTER — Ambulatory Visit: Payer: Self-pay | Admitting: Orthopedic Surgery

## 2015-04-27 ENCOUNTER — Encounter (HOSPITAL_COMMUNITY)
Admission: RE | Admit: 2015-04-27 | Discharge: 2015-04-27 | Disposition: A | Discharge status: Home / Self Care | Payer: Medicare Other | Source: Ambulatory Visit | Attending: Specialist | Admitting: Specialist

## 2015-04-27 ENCOUNTER — Encounter (HOSPITAL_COMMUNITY): Payer: Self-pay

## 2015-04-27 ENCOUNTER — Ambulatory Visit (HOSPITAL_COMMUNITY)
Admission: RE | Admit: 2015-04-27 | Discharge: 2015-04-27 | Disposition: A | Discharge status: Home / Self Care | Payer: Medicare Other | Source: Ambulatory Visit | Attending: Orthopedic Surgery | Admitting: Orthopedic Surgery

## 2015-04-27 DIAGNOSIS — M1712 Unilateral primary osteoarthritis, left knee: Secondary | ICD-10-CM | POA: Insufficient documentation

## 2015-04-27 HISTORY — DX: Type 2 diabetes mellitus without complications: E11.9

## 2015-04-27 HISTORY — DX: Unspecified osteoarthritis, unspecified site: M19.90

## 2015-04-27 LAB — BASIC METABOLIC PANEL
ANION GAP: 9 (ref 5–15)
BUN: 24 mg/dL — ABNORMAL HIGH (ref 6–20)
CO2: 27 mmol/L (ref 22–32)
Calcium: 9.4 mg/dL (ref 8.9–10.3)
Chloride: 103 mmol/L (ref 101–111)
Creatinine, Ser: 0.78 mg/dL (ref 0.61–1.24)
GFR calc non Af Amer: >60 mL/min (ref >60)
Glucose, Bld: 111 mg/dL — ABNORMAL HIGH (ref 65–99)
Potassium: 4.3 mmol/L (ref 3.5–5.1)
Sodium: 139 mmol/L (ref 135–145)

## 2015-04-27 LAB — URINALYSIS, ROUTINE W REFLEX MICROSCOPIC
Bilirubin Urine: NEGATIVE
GLUCOSE, UA: NEGATIVE mg/dL
Hgb urine dipstick: NEGATIVE
Ketones, ur: NEGATIVE mg/dL
Leukocytes, UA: NEGATIVE
NITRITE: NEGATIVE
PROTEIN: NEGATIVE mg/dL
Specific Gravity, Urine: 1.024 (ref 1.005–1.030)
UROBILINOGEN UA: 1 mg/dL (ref 0.0–1.0)
pH: 6.5 (ref 5.0–8.0)

## 2015-04-27 LAB — CBC
HCT: 46.6 % (ref 39.0–52.0)
HEMOGLOBIN: 15.7 g/dL (ref 13.0–17.0)
MCH: 29.1 pg (ref 26.0–34.0)
MCHC: 33.7 g/dL (ref 30.0–36.0)
MCV: 86.3 fL (ref 78.0–100.0)
Platelets: 257 10*3/uL (ref 150–400)
RBC: 5.4 MIL/uL (ref 4.22–5.81)
RDW: 12.8 % (ref 11.5–15.5)
WBC: 7.4 10*3/uL (ref 4.0–10.5)

## 2015-04-27 LAB — SURGICAL PCR SCREEN
MRSA, PCR: NEGATIVE
Staphylococcus aureus: NEGATIVE

## 2015-04-27 LAB — PROTIME-INR
INR: 1.01 (ref 0.00–1.49)
Prothrombin Time: 13.5 seconds (ref 11.6–15.2)

## 2015-04-27 LAB — APTT: APTT: 27 s (ref 24–37)

## 2015-04-27 NOTE — Progress Notes (Signed)
BMP results done 04/27/15 faxed via EPIC to Dr Tonita Cong.

## 2015-04-27 NOTE — H&P (Signed)
[name] Jeffrey Banks DOB: 05/28/1951 Married / Language: English / Race: White Male  H&P Date: 04/27/15  Chief Complaint: Left knee pain  History of Present Illness The patient is a 64 year old male who comes in today for a preoperative History and Physical. The patient is scheduled for a left total knee arthroplasty to be performed by Dr. Johnn Hai, MD at [facility] on May 06, 2015. Jeffrey Banks reports years of left knee pain, progressively worsening for the last year, with instability and swelling refractory to bracing, steroid injections, activity modifications, quad strengthening, medications. His pain is interfering with ADL's and quality of life at this point and he desires to procees with surgery. He has had to increase from 1/2 tab to 1 tab of Norco up to a few times a day when severe.  Dr. Tonita Cong and the patient mutually agreed to proceed with a total knee replacement. Risks and benefits of the procedure were discussed including stiffness, suboptimal range of motion, persistent pain, infection requiring removal of prosthesis and reinsertion, need for prophylactic antibiotics in the future, for example, dental procedures, possible need for manipulation, revision in the future and also anesthetic complications including DVT, PE, etc. We discussed the perioperative course, time in the hospital, postoperative recovery and the need for elevation to control swelling. We also discussed the predicted range of motion and the probability that squatting and kneeling would be unobtainable in the future. In addition, postoperative anticoagulation was discussed. We have obtained preoperative medical clearance as necessary. Provided her illustrated handout and discussed it in detail. They will enroll in the total joint replacement educational forum at the hospital.  Allergies  No Known Drug Allergies01/17/2014  Family History Heart Disease father Cancer grandmother mothers side Congestive Heart  Failure First Degree Relatives. father First Degree Relatives  Social History Tobacco use Former smoker. former smoker; smoke(d) 1 pack(s) per day Current work status working full time Drug/Alcohol Rehab (Currently) no Drug/Alcohol Rehab (Previously) no Exercise Exercises rarely; does running / walking Illicit drug use no Marital status married Number of flights of stairs before winded 1 Children 2 Pain Contract no Tobacco / smoke exposure no Alcohol use former drinker Living situation one level home with 3 steps to enter Post-Surgical Plans home with HHPT, then outpt PT here at [employer] none  Medication History Hydrocodone-Acetaminophen (5-325MG  Tablet, Oral) Active. Allegra Allergy (180MG  Tablet, Oral) Active. Medications Reconciled  Past Surgical History Tonsillectomy Neck Disc Surgery Cataract Surgery bilateral Spinal Fusion neck  Past Medical Hx Peripheral Neuropathy Sleep Apnea Lumbar Spine Disease spinal stenosis Other disease, cancer, significant illness elevated A1C  Review of Systems  General Not Present- Chills, Fatigue, Fever, Memory Loss, Night Sweats, Weight Gain and Weight Loss. Skin Not Present- Eczema, Hives, Itching, Lesions and Rash. HEENT Present- Tinnitus. Not Present- Dentures, Double Vision, Headache, Hearing Loss and Visual Loss. Respiratory Present- Allergies (seasonal). Not Present- Chronic Cough, Coughing up blood, Shortness of breath at rest and Shortness of breath with exertion. Cardiovascular Not Present- Chest Pain, Difficulty Breathing Lying Down, Murmur, Palpitations, Racing/skipping heartbeats and Swelling. Gastrointestinal Not Present- Abdominal Pain, Bloody Stool, Constipation, Diarrhea, Difficulty Swallowing, Heartburn, Jaundice, Loss of appetitie, Nausea and Vomiting. Male Genitourinary Not Present- Blood in Urine, Discharge, Flank Pain, Incontinence, Painful Urination, Urgency, Urinary  frequency, Urinary Retention, Urinating at Night and Weak urinary stream. Musculoskeletal Not Present- Back Pain, Joint Pain, Joint Swelling, Morning Stiffness, Muscle Pain, Muscle Weakness and Spasms. Neurological Not Present- Blackout spells, Difficulty with balance, Dizziness,  Paralysis, Tremor and Weakness. Psychiatric Not Present- Insomnia.  Physical Exam  General Mental Status -Alert, cooperative and good historian. General Appearance-pleasant, Not in acute distress. Orientation-Oriented X3. Build & Nutrition-Well nourished and Well developed.  Head and Neck Head-normocephalic, atraumatic . Neck Global Assessment - supple, no bruit auscultated on the right, no bruit auscultated on the left.  Eye Pupil - Bilateral-Regular and Round. Motion - Bilateral-EOMI.  Chest and Lung Exam Auscultation Breath sounds - clear at anterior chest wall and clear at posterior chest wall. Adventitious sounds - No Adventitious sounds.  Cardiovascular Auscultation Rhythm - Regular rate and rhythm. Heart Sounds - S1 WNL and S2 WNL. Murmurs & Other Heart Sounds - Auscultation of the heart reveals - No Murmurs.  Abdomen Palpation/Percussion Tenderness - Abdomen is non-tender to palpation. Rigidity (guarding) - Abdomen is soft. Auscultation Auscultation of the abdomen reveals - Bowel sounds normal.  Male Genitourinary Note: Not done, not pertinent to present illness   Musculoskeletal Note: Left Knee: Inspection and Palpation - Tenderness - medial joint line tender to palpation, no tenderness to palpation of the superior calf(no sign of DVT), no tenderness to palpation of the quadriceps tendon, no tenderness to palpation of the patellar tendon, no tenderness to palpation of the patella, no tenderness to palpation of the lateral joint line, no tenderness to palpation of the fibular head, no tenderness to palpation of the peroneal nerve. Crepitus - moderate. Patellar Tendon - no pain  to palpation of the patellar tendon. Swelling - periarticular swelling present. Effusion - trace. Tissue tension/texture is - soft. Pulses - 2+. Sensation - intact to light touch. Alignment - Tibiofemoral Alignment - varus alignment. Skin - Color - no ecchymosis, no erythema. Strength and Tone - Quadriceps - 5/5. Hamstrings - 5/5. ROM: Flexion - AROM - 120 . Extension - AROM - 3 . Stability - Valgus Laxity at 30 - None. Valgus Laxity at 0 - None. Varus Laxity at 30 - None. Varus Laxity at 0 - None. Lachman - Negative. Anterior Drawer Test - Negative. Posterior Drawer Test - Negative. Left Knee - Deformities/Malalignments/Discrepancies - no deformities noted. Special Tests - McMurray Test (lateral) - negative. McMurray Test (medial) - negative. Patellar Compression Pain - mild pain.  Imaging prior xrays reviewed from 04/2014 reviewed with severe end-stage degenerative changes left knee, bone-on-bone medial compartment, varus deformity.  Assessment & Plan  Primary osteoarthritis of left knee (M17.12)  Pt with end-stage left knee DJD, bone-on-bone, refractory to conservative tx, scheduled for left total knee replacement by Dr. Tonita Cong on 05/06/15. We again discussed the procedure itself as well as risks, complications and alternatives, including but not limited to DVT, PE, infx, bleeding, failure of procedure, need for secondary procedure including manipulation, nerve injury, ongoing pain/symptoms, anesthesia risk, even stroke or death. Also discussed typical post-op protocols, activity restrictions, need for PT, flexion/extension exercises, time out of work, he does have the option to work from home early if needed. Discussed need for DVT ppx post-op with Xarelto then ASA per protocol. Discussed dental ppx. Also discussed limitations post-operatively such as kneeling and squatting. All questions were answered. Patient desires to proceed with surgery as scheduled. Will hold ASA and NSAIDs accordingly. Will  remain NPO after MN night before surgery. Will present to [facility] for pre-op testing. Plan Xarelto 2 weeks post-op for DVT ppx then ASA. Plan ES Norco or Percocet for pain, Robaxin, Colace. Plan home with HHPT post-op with family members at home for assistance. Will follow up 10-14 days post-op for  staple removal and xrays.  Plan left total knee replacement  Signed electronically by Cecilie Kicks, PA-C for Dr. Tonita Cong

## 2015-04-27 NOTE — Progress Notes (Signed)
EKG- 10/14/14 on chart along with LOV from DR Einar Gip- 10/14/14 on chart  Stress Test- 11/30/14 on chart

## 2015-05-06 ENCOUNTER — Inpatient Hospital Stay (HOSPITAL_COMMUNITY): Payer: Medicare Other | Admitting: Anesthesiology

## 2015-05-06 ENCOUNTER — Inpatient Hospital Stay (HOSPITAL_COMMUNITY): Payer: Medicare Other

## 2015-05-06 ENCOUNTER — Inpatient Hospital Stay (HOSPITAL_COMMUNITY)
Admission: RE | Admit: 2015-05-06 | Discharge: 2015-05-09 | DRG: 470 | Disposition: A | Discharge status: Home Health | Payer: Medicare Other | Source: Ambulatory Visit | Attending: Specialist | Admitting: Specialist

## 2015-05-06 ENCOUNTER — Encounter (HOSPITAL_COMMUNITY): Payer: Self-pay | Admitting: *Deleted

## 2015-05-06 ENCOUNTER — Encounter (HOSPITAL_COMMUNITY)
Admission: RE | Disposition: A | Discharge status: Home Health | Payer: Self-pay | Source: Ambulatory Visit | Attending: Specialist

## 2015-05-06 DIAGNOSIS — G4733 Obstructive sleep apnea (adult) (pediatric): Secondary | ICD-10-CM | POA: Diagnosis present

## 2015-05-06 DIAGNOSIS — M549 Dorsalgia, unspecified: Secondary | ICD-10-CM | POA: Diagnosis not present

## 2015-05-06 DIAGNOSIS — Z981 Arthrodesis status: Secondary | ICD-10-CM | POA: Diagnosis not present

## 2015-05-06 DIAGNOSIS — Z87891 Personal history of nicotine dependence: Secondary | ICD-10-CM

## 2015-05-06 DIAGNOSIS — E119 Type 2 diabetes mellitus without complications: Secondary | ICD-10-CM | POA: Diagnosis present

## 2015-05-06 DIAGNOSIS — Z96652 Presence of left artificial knee joint: Secondary | ICD-10-CM | POA: Diagnosis not present

## 2015-05-06 DIAGNOSIS — M1712 Unilateral primary osteoarthritis, left knee: Principal | ICD-10-CM | POA: Diagnosis present

## 2015-05-06 DIAGNOSIS — Z471 Aftercare following joint replacement surgery: Secondary | ICD-10-CM | POA: Diagnosis not present

## 2015-05-06 DIAGNOSIS — M179 Osteoarthritis of knee, unspecified: Secondary | ICD-10-CM | POA: Diagnosis not present

## 2015-05-06 DIAGNOSIS — Z96651 Presence of right artificial knee joint: Secondary | ICD-10-CM

## 2015-05-06 DIAGNOSIS — E785 Hyperlipidemia, unspecified: Secondary | ICD-10-CM | POA: Diagnosis present

## 2015-05-06 DIAGNOSIS — Z6835 Body mass index (BMI) 35.0-35.9, adult: Secondary | ICD-10-CM

## 2015-05-06 DIAGNOSIS — M25562 Pain in left knee: Secondary | ICD-10-CM | POA: Diagnosis not present

## 2015-05-06 HISTORY — PX: TOTAL KNEE ARTHROPLASTY: SHX125

## 2015-05-06 LAB — TYPE AND SCREEN
ABO/RH(D): O POS
Antibody Screen: NEGATIVE

## 2015-05-06 LAB — GLUCOSE, CAPILLARY
GLUCOSE-CAPILLARY: 119 mg/dL — AB (ref 65–99)
Glucose-Capillary: 121 mg/dL — ABNORMAL HIGH (ref 65–99)
Glucose-Capillary: 112 mg/dL — ABNORMAL HIGH (ref 65–99)
Glucose-Capillary: 139 mg/dL — ABNORMAL HIGH (ref 65–99)
Glucose-Capillary: 98 mg/dL (ref 65–99)

## 2015-05-06 LAB — ABO/RH: ABO/RH(D): O POS

## 2015-05-06 SURGERY — ARTHROPLASTY, KNEE, TOTAL
Anesthesia: Monitor Anesthesia Care | Site: Knee | Laterality: Left

## 2015-05-06 MED ORDER — MIDAZOLAM HCL 2 MG/2ML IJ SOLN
INTRAMUSCULAR | Status: AC
  Filled 2015-05-06: qty 2

## 2015-05-06 MED ORDER — CEFAZOLIN SODIUM-DEXTROSE 2-3 GM-% IV SOLR
2.0000 g | INTRAVENOUS | Status: AC
  Administered 2015-05-06: 2 g via INTRAVENOUS

## 2015-05-06 MED ORDER — SENNOSIDES-DOCUSATE SODIUM 8.6-50 MG PO TABS: 1.0000 | ORAL_TABLET | Freq: Every evening | ORAL | Status: DC | PRN

## 2015-05-06 MED ORDER — ACETAMINOPHEN 160 MG/5ML PO SOLN
325.0000 mg | ORAL | Status: DC | PRN
Start: 2015-05-06 — End: 2015-05-06
  Filled 2015-05-06: qty 20.3

## 2015-05-06 MED ORDER — SODIUM CHLORIDE 0.9 % IJ SOLN
INTRAMUSCULAR | Status: AC
  Filled 2015-05-06: qty 10

## 2015-05-06 MED ORDER — ALPRAZOLAM 0.5 MG PO TABS: 0.5000 mg | ORAL_TABLET | Freq: Every evening | ORAL | Status: DC | PRN

## 2015-05-06 MED ORDER — LIDOCAINE HCL (CARDIAC) 20 MG/ML IV SOLN
INTRAVENOUS | Status: AC
  Filled 2015-05-06: qty 5

## 2015-05-06 MED ORDER — PROPOFOL 10 MG/ML IV BOLUS
INTRAVENOUS | Status: AC
Start: 2015-05-06 — End: 2015-05-06
  Filled 2015-05-06: qty 20

## 2015-05-06 MED ORDER — OXYCODONE HCL 5 MG/5ML PO SOLN
5.0000 mg | Freq: Once | ORAL | Status: DC | PRN
  Filled 2015-05-06: qty 5

## 2015-05-06 MED ORDER — SODIUM CHLORIDE 0.9 % IR SOLN
Status: DC | PRN
  Administered 2015-05-06 (×2): 1000 mL

## 2015-05-06 MED ORDER — DOCUSATE SODIUM 100 MG PO CAPS
100.0000 mg | ORAL_CAPSULE | Freq: Two times a day (BID) | ORAL | Status: DC
  Administered 2015-05-06 – 2015-05-09 (×6): 100 mg via ORAL

## 2015-05-06 MED ORDER — PROPOFOL 10 MG/ML IV BOLUS
INTRAVENOUS | Status: AC
  Filled 2015-05-06: qty 20

## 2015-05-06 MED ORDER — ONDANSETRON HCL 4 MG/2ML IJ SOLN
INTRAMUSCULAR | Status: AC
  Filled 2015-05-06: qty 2

## 2015-05-06 MED ORDER — ONDANSETRON HCL 4 MG PO TABS: 4.0000 mg | ORAL_TABLET | Freq: Four times a day (QID) | ORAL | Status: DC | PRN

## 2015-05-06 MED ORDER — PROPOFOL INFUSION 10 MG/ML OPTIME
INTRAVENOUS | Status: DC | PRN
  Administered 2015-05-06: 100 ug/kg/min via INTRAVENOUS

## 2015-05-06 MED ORDER — POLYMYXIN B SULFATE 500000 UNITS IJ SOLR
INTRAMUSCULAR | Status: AC
  Filled 2015-05-06: qty 1

## 2015-05-06 MED ORDER — ONDANSETRON HCL 4 MG/2ML IJ SOLN: 4.0000 mg | Freq: Four times a day (QID) | INTRAMUSCULAR | Status: DC | PRN

## 2015-05-06 MED ORDER — DEXAMETHASONE SODIUM PHOSPHATE 10 MG/ML IJ SOLN
INTRAMUSCULAR | Status: AC
  Filled 2015-05-06: qty 1

## 2015-05-06 MED ORDER — EPHEDRINE SULFATE 50 MG/ML IJ SOLN
INTRAMUSCULAR | Status: AC
  Filled 2015-05-06: qty 1

## 2015-05-06 MED ORDER — ROCURONIUM BROMIDE 100 MG/10ML IV SOLN
INTRAVENOUS | Status: AC
  Filled 2015-05-06: qty 1

## 2015-05-06 MED ORDER — HYDROCODONE-ACETAMINOPHEN 10-325 MG PO TABS: 1.0000 | ORAL_TABLET | ORAL | Status: DC | PRN

## 2015-05-06 MED ORDER — RIVAROXABAN 10 MG PO TABS: 10.0000 mg | ORAL_TABLET | Freq: Every day | ORAL | Status: DC

## 2015-05-06 MED ORDER — BUPIVACAINE IN DEXTROSE 0.75-8.25 % IT SOLN
INTRATHECAL | Status: DC | PRN
  Administered 2015-05-06: 2 mL via INTRATHECAL

## 2015-05-06 MED ORDER — OXYCODONE HCL 5 MG PO TABS
5.0000 mg | ORAL_TABLET | ORAL | Status: DC | PRN
  Administered 2015-05-06 (×3): 10 mg via ORAL
  Administered 2015-05-06: 5 mg via ORAL
  Administered 2015-05-07 – 2015-05-09 (×15): 10 mg via ORAL
  Filled 2015-05-06 (×10): qty 2
  Filled 2015-05-06: qty 1
  Filled 2015-05-06 (×8): qty 2

## 2015-05-06 MED ORDER — INSULIN ASPART 100 UNIT/ML ~~LOC~~ SOLN: 0.0000 [IU] | Freq: Three times a day (TID) | SUBCUTANEOUS | Status: DC

## 2015-05-06 MED ORDER — RIVAROXABAN 10 MG PO TABS
10.0000 mg | ORAL_TABLET | Freq: Every day | ORAL | Status: DC
  Administered 2015-05-07 – 2015-05-09 (×3): 10 mg via ORAL
  Filled 2015-05-06 (×4): qty 1

## 2015-05-06 MED ORDER — HYDROMORPHONE HCL 1 MG/ML IJ SOLN: 0.2500 mg | INTRAMUSCULAR | Status: DC | PRN

## 2015-05-06 MED ORDER — MENTHOL 3 MG MT LOZG: 1.0000 | LOZENGE | OROMUCOSAL | Status: DC | PRN

## 2015-05-06 MED ORDER — METOCLOPRAMIDE HCL 5 MG/ML IJ SOLN: 5.0000 mg | Freq: Three times a day (TID) | INTRAMUSCULAR | Status: DC | PRN

## 2015-05-06 MED ORDER — LACTATED RINGERS IV SOLN
INTRAVENOUS | Status: DC
  Administered 2015-05-06 (×2): via INTRAVENOUS

## 2015-05-06 MED ORDER — ALUM & MAG HYDROXIDE-SIMETH 200-200-20 MG/5ML PO SUSP
30.0000 mL | ORAL | Status: DC | PRN
  Administered 2015-05-07 – 2015-05-08 (×4): 30 mL via ORAL
  Filled 2015-05-06 (×4): qty 30

## 2015-05-06 MED ORDER — METHOCARBAMOL 500 MG PO TABS: 500.0000 mg | ORAL_TABLET | Freq: Three times a day (TID) | ORAL | Status: DC | PRN

## 2015-05-06 MED ORDER — HYDROMORPHONE HCL 1 MG/ML IJ SOLN
1.0000 mg | INTRAMUSCULAR | Status: DC | PRN
  Administered 2015-05-07 (×2): 1 mg via INTRAVENOUS
  Filled 2015-05-06 (×2): qty 1

## 2015-05-06 MED ORDER — OXYCODONE HCL 5 MG PO TABS: 5.0000 mg | ORAL_TABLET | Freq: Once | ORAL | Status: DC | PRN

## 2015-05-06 MED ORDER — LORATADINE 10 MG PO TABS
10.0000 mg | ORAL_TABLET | Freq: Every day | ORAL | Status: DC
  Administered 2015-05-08: 10 mg via ORAL
  Filled 2015-05-06 (×3): qty 1

## 2015-05-06 MED ORDER — PHENOL 1.4 % MT LIQD: 1.0000 | OROMUCOSAL | Status: DC | PRN

## 2015-05-06 MED ORDER — MAGNESIUM CITRATE PO SOLN: 1.0000 | Freq: Once | ORAL | Status: AC | PRN

## 2015-05-06 MED ORDER — DIPHENHYDRAMINE HCL 12.5 MG/5ML PO ELIX: 12.5000 mg | ORAL_SOLUTION | ORAL | Status: DC | PRN

## 2015-05-06 MED ORDER — CEFAZOLIN SODIUM-DEXTROSE 2-3 GM-% IV SOLR
2.0000 g | Freq: Four times a day (QID) | INTRAVENOUS | Status: AC
  Administered 2015-05-06 – 2015-05-07 (×3): 2 g via INTRAVENOUS
  Filled 2015-05-06 (×3): qty 50

## 2015-05-06 MED ORDER — BISACODYL 5 MG PO TBEC: 5.0000 mg | DELAYED_RELEASE_TABLET | Freq: Every day | ORAL | Status: DC | PRN

## 2015-05-06 MED ORDER — ACETAMINOPHEN 325 MG PO TABS
325.0000 mg | ORAL_TABLET | ORAL | Status: DC | PRN
Start: 2015-05-06 — End: 2015-05-06

## 2015-05-06 MED ORDER — MIDAZOLAM HCL 5 MG/5ML IJ SOLN
INTRAMUSCULAR | Status: DC | PRN
  Administered 2015-05-06 (×2): 1 mg via INTRAVENOUS
  Administered 2015-05-06: 2 mg via INTRAVENOUS

## 2015-05-06 MED ORDER — CEFAZOLIN SODIUM-DEXTROSE 2-3 GM-% IV SOLR
INTRAVENOUS | Status: AC
  Filled 2015-05-06: qty 50

## 2015-05-06 MED ORDER — ACETAMINOPHEN 650 MG RE SUPP: 650.0000 mg | Freq: Four times a day (QID) | RECTAL | Status: DC | PRN

## 2015-05-06 MED ORDER — METHOCARBAMOL 1000 MG/10ML IJ SOLN
500.0000 mg | Freq: Four times a day (QID) | INTRAMUSCULAR | Status: DC | PRN
  Administered 2015-05-06: 500 mg via INTRAVENOUS
  Filled 2015-05-06 (×2): qty 5

## 2015-05-06 MED ORDER — FENTANYL CITRATE (PF) 250 MCG/5ML IJ SOLN
INTRAMUSCULAR | Status: AC
  Filled 2015-05-06: qty 5

## 2015-05-06 MED ORDER — ALPRAZOLAM 0.5 MG PO TABS
0.5000 mg | ORAL_TABLET | Freq: Every evening | ORAL | Status: DC | PRN
Start: 2015-05-06 — End: 2015-05-06

## 2015-05-06 MED ORDER — KCL IN DEXTROSE-NACL 20-5-0.45 MEQ/L-%-% IV SOLN
INTRAVENOUS | Status: AC
  Administered 2015-05-06 (×2): via INTRAVENOUS
  Filled 2015-05-06 (×2): qty 1000

## 2015-05-06 MED ORDER — BUPIVACAINE-EPINEPHRINE 0.25% -1:200000 IJ SOLN
INTRAMUSCULAR | Status: AC
  Filled 2015-05-06: qty 1

## 2015-05-06 MED ORDER — KCL IN DEXTROSE-NACL 20-5-0.45 MEQ/L-%-% IV SOLN
INTRAVENOUS | Status: AC
  Administered 2015-05-06: 11:00:00
  Filled 2015-05-06: qty 1000

## 2015-05-06 MED ORDER — DOCUSATE SODIUM 100 MG PO CAPS: 100.0000 mg | ORAL_CAPSULE | Freq: Two times a day (BID) | ORAL | Status: DC | PRN

## 2015-05-06 MED ORDER — METHOCARBAMOL 500 MG PO TABS
500.0000 mg | ORAL_TABLET | Freq: Four times a day (QID) | ORAL | Status: DC | PRN
  Administered 2015-05-06 – 2015-05-09 (×6): 500 mg via ORAL
  Filled 2015-05-06 (×7): qty 1

## 2015-05-06 MED ORDER — ACETAMINOPHEN 325 MG PO TABS
650.0000 mg | ORAL_TABLET | Freq: Four times a day (QID) | ORAL | Status: DC | PRN
  Administered 2015-05-06 – 2015-05-08 (×4): 650 mg via ORAL
  Filled 2015-05-06 (×4): qty 2

## 2015-05-06 MED ORDER — POLYMYXIN B SULFATE 500000 UNITS IJ SOLR
INTRAMUSCULAR | Status: DC | PRN
  Administered 2015-05-06: 500 mL

## 2015-05-06 MED ORDER — BUPIVACAINE-EPINEPHRINE (PF) 0.25% -1:200000 IJ SOLN
INTRAMUSCULAR | Status: AC
  Filled 2015-05-06: qty 30

## 2015-05-06 MED ORDER — RISAQUAD PO CAPS
1.0000 | ORAL_CAPSULE | Freq: Every day | ORAL | Status: DC
  Administered 2015-05-07 – 2015-05-09 (×3): 1 via ORAL
  Filled 2015-05-06 (×3): qty 1

## 2015-05-06 MED ORDER — BUPIVACAINE-EPINEPHRINE 0.25% -1:200000 IJ SOLN
INTRAMUSCULAR | Status: DC | PRN
  Administered 2015-05-06: 30 mL
  Administered 2015-05-06: 25 mL

## 2015-05-06 MED ORDER — METOCLOPRAMIDE HCL 5 MG PO TABS
5.0000 mg | ORAL_TABLET | Freq: Three times a day (TID) | ORAL | Status: DC | PRN
  Filled 2015-05-06: qty 2

## 2015-05-06 MED ORDER — ONDANSETRON HCL 4 MG/2ML IJ SOLN
INTRAMUSCULAR | Status: DC | PRN
  Administered 2015-05-06: 4 mg via INTRAVENOUS

## 2015-05-06 SURGICAL SUPPLY — 73 items
BAG ZIPLOCK 12X15 (MISCELLANEOUS) ×2 IMPLANT
BANDAGE ELASTIC 4 VELCRO ST LF (GAUZE/BANDAGES/DRESSINGS) ×2 IMPLANT
BANDAGE ELASTIC 6 VELCRO ST LF (GAUZE/BANDAGES/DRESSINGS) ×2 IMPLANT
BANDAGE ESMARK 6X9 LF (GAUZE/BANDAGES/DRESSINGS) ×1 IMPLANT
BLADE SAG 18X100X1.27 (BLADE) ×2 IMPLANT
BLADE SAW SGTL 13.0X1.19X90.0M (BLADE) ×2 IMPLANT
BNDG ESMARK 6X9 LF (GAUZE/BANDAGES/DRESSINGS) ×2
CAP KNEE TOTAL 3 SIGMA ×2 IMPLANT
CEMENT HV SMART SET (Cement) ×4 IMPLANT
CHLORAPREP W/TINT 26ML (MISCELLANEOUS) IMPLANT
CLOTH 2% CHLOROHEXIDINE 3PK (PERSONAL CARE ITEMS) ×2 IMPLANT
CUFF TOURN SGL QUICK 34 (TOURNIQUET CUFF) ×1
CUFF TRNQT CYL 34X4X40X1 (TOURNIQUET CUFF) ×1 IMPLANT
DECANTER SPIKE VIAL GLASS SM (MISCELLANEOUS) ×2 IMPLANT
DRAPE INCISE IOBAN 66X45 STRL (DRAPES) ×2 IMPLANT
DRAPE ORTHO SPLIT 77X108 STRL (DRAPES) ×2
DRAPE POUCH INSTRU U-SHP 10X18 (DRAPES) ×2 IMPLANT
DRAPE SHEET LG 3/4 BI-LAMINATE (DRAPES) ×2 IMPLANT
DRAPE SURG ORHT 6 SPLT 77X108 (DRAPES) ×2 IMPLANT
DRAPE U-SHAPE 47X51 STRL (DRAPES) ×2 IMPLANT
DRSG ADAPTIC 3X8 NADH LF (GAUZE/BANDAGES/DRESSINGS) IMPLANT
DRSG AQUACEL AG ADV 3.5X10 (GAUZE/BANDAGES/DRESSINGS) ×2 IMPLANT
DRSG PAD ABDOMINAL 8X10 ST (GAUZE/BANDAGES/DRESSINGS) IMPLANT
DRSG TEGADERM 4X4.75 (GAUZE/BANDAGES/DRESSINGS) IMPLANT
DURAPREP 26ML APPLICATOR (WOUND CARE) ×2 IMPLANT
ELECT REM PT RETURN 9FT ADLT (ELECTROSURGICAL) ×2
ELECTRODE REM PT RTRN 9FT ADLT (ELECTROSURGICAL) ×1 IMPLANT
EVACUATOR 1/8 PVC DRAIN (DRAIN) IMPLANT
FACESHIELD WRAPAROUND (MASK) ×10 IMPLANT
GAUZE SPONGE 2X2 8PLY STRL LF (GAUZE/BANDAGES/DRESSINGS) IMPLANT
GAUZE SPONGE 4X4 12PLY STRL (GAUZE/BANDAGES/DRESSINGS) IMPLANT
GLOVE BIOGEL PI IND STRL 7.5 (GLOVE) ×1 IMPLANT
GLOVE BIOGEL PI IND STRL 8 (GLOVE) ×1 IMPLANT
GLOVE BIOGEL PI INDICATOR 7.5 (GLOVE) ×1
GLOVE BIOGEL PI INDICATOR 8 (GLOVE) ×1
GLOVE SURG SS PI 7.5 STRL IVOR (GLOVE) ×2 IMPLANT
GLOVE SURG SS PI 8.0 STRL IVOR (GLOVE) ×4 IMPLANT
GOWN STRL REUS W/TWL XL LVL3 (GOWN DISPOSABLE) ×4 IMPLANT
HANDPIECE INTERPULSE COAX TIP (DISPOSABLE) ×1
IMMOBILIZER KNEE 20 (SOFTGOODS) ×2 IMPLANT
IMMOBILIZER KNEE 20 THIGH 36 (SOFTGOODS) IMPLANT
KIT BASIN OR (CUSTOM PROCEDURE TRAY) ×2 IMPLANT
MANIFOLD NEPTUNE II (INSTRUMENTS) ×2 IMPLANT
NDL SAFETY ECLIPSE 18X1.5 (NEEDLE) ×1 IMPLANT
NEEDLE HYPO 18GX1.5 SHARP (NEEDLE) ×1
NS IRRIG 1000ML POUR BTL (IV SOLUTION) IMPLANT
PACK TOTAL JOINT (CUSTOM PROCEDURE TRAY) ×2 IMPLANT
PADDING CAST COTTON 6X4 STRL (CAST SUPPLIES) IMPLANT
PEN SKIN MARKING BROAD (MISCELLANEOUS) ×2 IMPLANT
POSITIONER SURGICAL ARM (MISCELLANEOUS) ×2 IMPLANT
SET HNDPC FAN SPRY TIP SCT (DISPOSABLE) ×1 IMPLANT
SPONGE GAUZE 2X2 STER 10/PKG (GAUZE/BANDAGES/DRESSINGS)
SPONGE SURGIFOAM ABS GEL 100 (HEMOSTASIS) IMPLANT
STAPLER VISISTAT (STAPLE) IMPLANT
STAPLER VISISTAT 35W (STAPLE) ×2 IMPLANT
STRIP CLOSURE SKIN 1/2X4 (GAUZE/BANDAGES/DRESSINGS) IMPLANT
SUCTION FRAZIER 12FR DISP (SUCTIONS) ×2 IMPLANT
SUT BONE WAX W31G (SUTURE) ×2 IMPLANT
SUT MNCRL AB 4-0 PS2 18 (SUTURE) IMPLANT
SUT VIC AB 1 CT1 27 (SUTURE) ×2
SUT VIC AB 1 CT1 27XBRD ANTBC (SUTURE) ×2 IMPLANT
SUT VIC AB 2-0 CT1 27 (SUTURE) ×3
SUT VIC AB 2-0 CT1 TAPERPNT 27 (SUTURE) ×3 IMPLANT
SUT VLOC 180 0 24IN GS25 (SUTURE) ×2 IMPLANT
SYR 50ML LL SCALE MARK (SYRINGE) ×2 IMPLANT
TOWEL OR 17X26 10 PK STRL BLUE (TOWEL DISPOSABLE) ×2 IMPLANT
TOWEL OR NON WOVEN STRL DISP B (DISPOSABLE) IMPLANT
TOWER CARTRIDGE SMART MIX (DISPOSABLE) ×2 IMPLANT
TRAY FOLEY W/METER SILVER 14FR (SET/KITS/TRAYS/PACK) IMPLANT
TRAY FOLEY W/METER SILVER 16FR (SET/KITS/TRAYS/PACK) ×2 IMPLANT
WATER STERILE IRR 1500ML POUR (IV SOLUTION) ×2 IMPLANT
WRAP KNEE MAXI GEL POST OP (GAUZE/BANDAGES/DRESSINGS) ×2 IMPLANT
YANKAUER SUCT BULB TIP 10FT TU (MISCELLANEOUS) ×2 IMPLANT

## 2015-05-06 NOTE — Anesthesia Postprocedure Evaluation (Signed)
  Anesthesia Post-op Note  Patient: Jeffrey Banks  Procedure(s) Performed: Procedure(s): LEFT TOTAL KNEE ARTHROPLASTY (Left)  Patient Location: PACU  Anesthesia Type:MAC and Spinal  Level of Consciousness: awake  Airway and Oxygen Therapy: Patient Spontanous Breathing and Patient connected to nasal cannula oxygen  Post-op Pain: none  Post-op Assessment: Post-op Vital signs reviewed, Patient's Cardiovascular Status Stable, Respiratory Function Stable, Patent Airway, No signs of Nausea or vomiting and Pain level controlled LLE Motor Response: Purposeful movement LLE Sensation: Decreased RLE Motor Response: Purposeful movement RLE Sensation: Decreased L Sensory Level: L5-Outer lower leg, top of foot, great toe R Sensory Level: S1-Sole of foot, small toes  Post-op Vital Signs: Reviewed and stable  Last Vitals:  Filed Vitals:   05/06/15 1105  BP: 141/72  Pulse: 57  Temp: 36.5 C  Resp: 14    Complications: No apparent anesthesia complications

## 2015-05-06 NOTE — Anesthesia Preprocedure Evaluation (Addendum)
Anesthesia Evaluation  Patient identified by MRN, date of birth, ID band Patient awake    Reviewed: Allergy & Precautions, NPO status , Patient's Chart, lab work & pertinent test results  History of Anesthesia Complications Negative for: history of anesthetic complications  Airway Mallampati: III  TM Distance: >3 FB Neck ROM: Full    Dental  (+) Teeth Intact   Pulmonary sleep apnea and Continuous Positive Airway Pressure Ventilation , former smoker,  breath sounds clear to auscultation        Cardiovascular negative cardio ROS  Rhythm:Regular     Neuro/Psych PSYCHIATRIC DISORDERS Depression negative neurological ROS     GI/Hepatic negative GI ROS, Neg liver ROS,   Endo/Other  diabetesMorbid obesity  Renal/GU      Musculoskeletal  (+) Arthritis -,   Abdominal   Peds  Hematology negative hematology ROS (+)   Anesthesia Other Findings   Reproductive/Obstetrics                            Anesthesia Physical Anesthesia Plan  ASA: II  Anesthesia Plan: MAC and Spinal   Post-op Pain Management:    Induction: Intravenous  Airway Management Planned: Nasal Cannula  Additional Equipment: None  Intra-op Plan:   Post-operative Plan:   Informed Consent: I have reviewed the patients History and Physical, chart, labs and discussed the procedure including the risks, benefits and alternatives for the proposed anesthesia with the patient or authorized representative who has indicated his/her understanding and acceptance.   Dental advisory given  Plan Discussed with: CRNA and Surgeon  Anesthesia Plan Comments:         Anesthesia Quick Evaluation

## 2015-05-06 NOTE — Interval H&P Note (Signed)
History and Physical Interval Note:  05/06/2015 7:16 AM  Jeffrey Banks  has presented today for surgery, with the diagnosis of LEFT KNEE DJD  The various methods of treatment have been discussed with the patient and family. After consideration of risks, benefits and other options for treatment, the patient has consented to  Procedure(s): LEFT TOTAL KNEE ARTHROPLASTY (Left) as a surgical intervention .  The patient's history has been reviewed, patient examined, no change in status, stable for surgery.  I have reviewed the patient's chart and labs.  Questions were answered to the patient's satisfaction.     Phylicia Mcgaugh C

## 2015-05-06 NOTE — Op Note (Signed)
NAMESUKHMAN, MARTINE                  ACCOUNT NO.:  [id number]  MEDICAL RECORD NO.:  [id number]  LOCATION:  [facility]                         FACILITY:  [facility]  PHYSICIAN:  Susa Day, M.D.    DATE OF BIRTH:  18-Mar-1951  DATE OF PROCEDURE:  05/06/2015 DATE OF DISCHARGE:                              OPERATIVE REPORT   PREOPERATIVE DIAGNOSES:  End-stage osteoarthrosis of the left knee, degenerative joint disease.  POSTOPERATIVE DIAGNOSES:  End-stage osteoarthrosis of the left knee, degenerative joint disease.  PROCEDURE PERFORMED:  Left total knee arthroplasty.  ANESTHESIA:  General.  ASSISTANT:  Cleophas Dunker, P.A.  COMPONENTS:  DePuy rotating platform, 5 femur, 5 tibia, 12.5 insert, 38 patella.  HISTORY:  A 64 year old male with bone-on-bone arthrosis, refractory to conservative treatment, severe effect on his activities of daily living, indicated for replacement of degenerated joint.  Risk and benefits were discussed including bleeding, infection, damage to neurovascular structure, DVT, PE, anesthetic complications, etc.  TECHNIQUE:  With the patient in supine position, after induction of adequate general anesthesia, 2 g of Kefzol, the left lower extremity was prepped and draped and exsanguinated in usual sterile fashion.  Thigh tourniquet was inflated to 300 mmHg.  Knee was flexed.  Midline incision made, full-thickness flaps developed.  Median parapatellar arthrotomy performed.  Soft tissue was elevated medially, preserving the MCL.  Knee was flexed.  Tricompartmental osteoarthrosis was noted.  Osteophytes were removed with a rongeur.  Full synovectomy was performed.  Remnants of medial and lateral menisci were removed as was the ACL.  Step drill was utilized to enter the femoral canal, was irrigated, 5-degree left, intramedullary guide placed, 11 off the distal femur.  Distal femoral cut was then performed.  We then sized the distal femur off the anterior cortex to a 5,  this was pinned.  We checked the anterior cortex, pinned at a 5 and 3 degrees of external rotation.  We performed our femoral cut, performed the anterior, posterior and chamfer cuts.  There was no notching to the cortex.  We turned our attention to the tibia.  Tibia was subluxed.  Again, bone-on-bone arthrosis was noted in all three compartments and osteophytes.  This was particularly on the medial side, 4 off the medial side and was chosen parallel to the shaft, bisecting the tibiotalar joint, slight slope, this was then pinned.  We used oscillating saw, performed our proximal tibial cut.  Soft tissue was protected at all times.  We then checked our flexion and extension gaps and they were equivalent.  We then completed the tibia, it was sized to a 5 tray, pinned just to the medial aspect of the tibial tubercle, maximizing the surface.  It was pinned, drilled centrally and a punch guide utilized.  Then, we turned our attention back towards the femur. Box cut was then performed after using the jig bisecting the canal, performed our box cut.  The trial of femur and tibia was used with a 12.5 insert.  We had full extension and full flexion, good stability, varus and valgus stressing at 0 and 30 degrees.  We then measured the patella, it was at a 25.  We planed it to  a 16, utilized a 9.5 patellar jig cut.  Sized it to a 38, drilled our PEG holes, medializing them, placed a trial patella and reduced it and we had excellent patellofemoral tracking.  All trials were removed.  We checked posteriorly and removed remnants of medial lateral menisci, cauterized the geniculates.  Popliteus was intact.  Loose bodies were removed. Pulsatile lavage was used to clean the entire joint.  We then infiltrated the joint with 0.25% Marcaine with epinephrine.  Knee was flexed.  McHale retractors were used.  All surfaces were thoroughly dried.  We mixed the cement on the back table in appropriate fashion  and injecting into the tibial canal under pressure, impacted our 5 tibial tray, redundant cement removed.  We cemented the femoral component, redundant cement removed, trial 12.5 placed, reduced, held in extension throughout the curing of the cement.  The patellar component was cemented and clamped as well.  All redundant cement was removed, copiously irrigated the wound.  After full curing of the cement, we ranged to see it had full range with excellent stability.  We chose the 12.5, the trial was removed.  Then, we meticulously removed all redundant cement from the joint.  It was then copiously irrigated with antibiotic irrigation.  We then selected a 12.5 insert, permanent, this was then inserted, we reduced it and we had excellent range and good stability.  Following this, we deflated the tourniquet at 71 minutes. Cauterized any bleeders.  There was very little bleeding.  We selected not to putting the drain therefore.  Placed Marcaine with epinephrine into the joint.  With the knee slightly flexed, we reapproximated the patellar ligament with #1 Vicryl interrupted figure-of-eight sutures and then reinforced it with a running V-Loc, subcu with 2-0 and skin with staples, and irrigating throughout.  He had flexion to gravity at 90 degrees.  Excellent patellofemoral tracking.  Good stability of varus and valgus stressing at 0 and 30 degrees.  Sterile dressing applied, secured with an Ace.  The patient was then transported to the hospital bed to the recovery room in satisfactory condition.  The patient tolerated the procedure well.  No complications.  Assistant, Cleophas Dunker, was used throughout the case and necessary to provide exposure, closure, patient positioning, and retraction.  Also under anesthesia, the patient had a spinal.     Susa Day, M.D.     JB/MEDQ  D:  05/06/2015  T:  05/06/2015  Job:  [id number]

## 2015-05-06 NOTE — H&P (View-Only) (Signed)
[name] Jeffrey Banks DOB: 12-30-1950 Married / Language: English / Race: White Male  H&P Date: 04/27/15  Chief Complaint: Left knee pain  History of Present Illness The patient is a 64 year old male who comes in today for a preoperative History and Physical. The patient is scheduled for a left total knee arthroplasty to be performed by Dr. Johnn Hai, MD at [facility] on May 06, 2015. Jeffrey Banks reports years of left knee pain, progressively worsening for the last year, with instability and swelling refractory to bracing, steroid injections, activity modifications, quad strengthening, medications. His pain is interfering with ADL's and quality of life at this point and he desires to procees with surgery. He has had to increase from 1/2 tab to 1 tab of Norco up to a few times a day when severe.  Dr. Tonita Cong and the patient mutually agreed to proceed with a total knee replacement. Risks and benefits of the procedure were discussed including stiffness, suboptimal range of motion, persistent pain, infection requiring removal of prosthesis and reinsertion, need for prophylactic antibiotics in the future, for example, dental procedures, possible need for manipulation, revision in the future and also anesthetic complications including DVT, PE, etc. We discussed the perioperative course, time in the hospital, postoperative recovery and the need for elevation to control swelling. We also discussed the predicted range of motion and the probability that squatting and kneeling would be unobtainable in the future. In addition, postoperative anticoagulation was discussed. We have obtained preoperative medical clearance as necessary. Provided her illustrated handout and discussed it in detail. They will enroll in the total joint replacement educational forum at the hospital.  Allergies  No Known Drug Allergies01/17/2014  Family History Heart Disease father Cancer grandmother mothers side Congestive Heart  Failure First Degree Relatives. father First Degree Relatives  Social History Tobacco use Former smoker. former smoker; smoke(d) 1 pack(s) per day Current work status working full time Drug/Alcohol Rehab (Currently) no Drug/Alcohol Rehab (Previously) no Exercise Exercises rarely; does running / walking Illicit drug use no Marital status married Number of flights of stairs before winded 1 Children 2 Pain Contract no Tobacco / smoke exposure no Alcohol use former drinker Living situation one level home with 3 steps to enter Post-Surgical Plans home with HHPT, then outpt PT here at [employer] none  Medication History Hydrocodone-Acetaminophen (5-325MG  Tablet, Oral) Active. Allegra Allergy (180MG  Tablet, Oral) Active. Medications Reconciled  Past Surgical History Tonsillectomy Neck Disc Surgery Cataract Surgery bilateral Spinal Fusion neck  Past Medical Hx Peripheral Neuropathy Sleep Apnea Lumbar Spine Disease spinal stenosis Other disease, cancer, significant illness elevated A1C  Review of Systems  General Not Present- Chills, Fatigue, Fever, Memory Loss, Night Sweats, Weight Gain and Weight Loss. Skin Not Present- Eczema, Hives, Itching, Lesions and Rash. HEENT Present- Tinnitus. Not Present- Dentures, Double Vision, Headache, Hearing Loss and Visual Loss. Respiratory Present- Allergies (seasonal). Not Present- Chronic Cough, Coughing up blood, Shortness of breath at rest and Shortness of breath with exertion. Cardiovascular Not Present- Chest Pain, Difficulty Breathing Lying Down, Murmur, Palpitations, Racing/skipping heartbeats and Swelling. Gastrointestinal Not Present- Abdominal Pain, Bloody Stool, Constipation, Diarrhea, Difficulty Swallowing, Heartburn, Jaundice, Loss of appetitie, Nausea and Vomiting. Male Genitourinary Not Present- Blood in Urine, Discharge, Flank Pain, Incontinence, Painful Urination, Urgency, Urinary  frequency, Urinary Retention, Urinating at Night and Weak urinary stream. Musculoskeletal Not Present- Back Pain, Joint Pain, Joint Swelling, Morning Stiffness, Muscle Pain, Muscle Weakness and Spasms. Neurological Not Present- Blackout spells, Difficulty with balance, Dizziness,  Paralysis, Tremor and Weakness. Psychiatric Not Present- Insomnia.  Physical Exam  General Mental Status -Alert, cooperative and good historian. General Appearance-pleasant, Not in acute distress. Orientation-Oriented X3. Build & Nutrition-Well nourished and Well developed.  Head and Neck Head-normocephalic, atraumatic . Neck Global Assessment - supple, no bruit auscultated on the right, no bruit auscultated on the left.  Eye Pupil - Bilateral-Regular and Round. Motion - Bilateral-EOMI.  Chest and Lung Exam Auscultation Breath sounds - clear at anterior chest wall and clear at posterior chest wall. Adventitious sounds - No Adventitious sounds.  Cardiovascular Auscultation Rhythm - Regular rate and rhythm. Heart Sounds - S1 WNL and S2 WNL. Murmurs & Other Heart Sounds - Auscultation of the heart reveals - No Murmurs.  Abdomen Palpation/Percussion Tenderness - Abdomen is non-tender to palpation. Rigidity (guarding) - Abdomen is soft. Auscultation Auscultation of the abdomen reveals - Bowel sounds normal.  Male Genitourinary Note: Not done, not pertinent to present illness   Musculoskeletal Note: Left Knee: Inspection and Palpation - Tenderness - medial joint line tender to palpation, no tenderness to palpation of the superior calf(no sign of DVT), no tenderness to palpation of the quadriceps tendon, no tenderness to palpation of the patellar tendon, no tenderness to palpation of the patella, no tenderness to palpation of the lateral joint line, no tenderness to palpation of the fibular head, no tenderness to palpation of the peroneal nerve. Crepitus - moderate. Patellar Tendon - no pain  to palpation of the patellar tendon. Swelling - periarticular swelling present. Effusion - trace. Tissue tension/texture is - soft. Pulses - 2+. Sensation - intact to light touch. Alignment - Tibiofemoral Alignment - varus alignment. Skin - Color - no ecchymosis, no erythema. Strength and Tone - Quadriceps - 5/5. Hamstrings - 5/5. ROM: Flexion - AROM - 120 . Extension - AROM - 3 . Stability - Valgus Laxity at 30 - None. Valgus Laxity at 0 - None. Varus Laxity at 30 - None. Varus Laxity at 0 - None. Lachman - Negative. Anterior Drawer Test - Negative. Posterior Drawer Test - Negative. Left Knee - Deformities/Malalignments/Discrepancies - no deformities noted. Special Tests - McMurray Test (lateral) - negative. McMurray Test (medial) - negative. Patellar Compression Pain - mild pain.  Imaging prior xrays reviewed from 04/2014 reviewed with severe end-stage degenerative changes left knee, bone-on-bone medial compartment, varus deformity.  Assessment & Plan  Primary osteoarthritis of left knee (M17.12)  Pt with end-stage left knee DJD, bone-on-bone, refractory to conservative tx, scheduled for left total knee replacement by Dr. Tonita Cong on 05/06/15. We again discussed the procedure itself as well as risks, complications and alternatives, including but not limited to DVT, PE, infx, bleeding, failure of procedure, need for secondary procedure including manipulation, nerve injury, ongoing pain/symptoms, anesthesia risk, even stroke or death. Also discussed typical post-op protocols, activity restrictions, need for PT, flexion/extension exercises, time out of work, he does have the option to work from home early if needed. Discussed need for DVT ppx post-op with Xarelto then ASA per protocol. Discussed dental ppx. Also discussed limitations post-operatively such as kneeling and squatting. All questions were answered. Patient desires to proceed with surgery as scheduled. Will hold ASA and NSAIDs accordingly. Will  remain NPO after MN night before surgery. Will present to [facility] for pre-op testing. Plan Xarelto 2 weeks post-op for DVT ppx then ASA. Plan ES Norco or Percocet for pain, Robaxin, Colace. Plan home with HHPT post-op with family members at home for assistance. Will follow up 10-14 days post-op for  staple removal and xrays.  Plan left total knee replacement  Signed electronically by Cecilie Kicks, PA-C for Dr. Tonita Cong

## 2015-05-06 NOTE — Anesthesia Procedure Notes (Addendum)
Spinal Patient location during procedure: OR Staffing Anesthesiologist: Wafa Martes, CHRIS Preanesthetic Checklist Completed: patient identified, surgical consent, pre-op evaluation, timeout performed, IV checked, risks and benefits discussed and monitors and equipment checked Spinal Block Patient position: sitting Prep: site prepped and draped and DuraPrep Patient monitoring: heart rate, cardiac monitor, continuous pulse ox and blood pressure Approach: midline Location: L4-5 Injection technique: single-shot Needle Needle type: Sprotte  Needle gauge: 24 G Needle length: 10 cm Assessment Sensory level: T6

## 2015-05-06 NOTE — Progress Notes (Signed)
Took over patients care at 4:00pm. Patient is alert and oriented. VS stable 10mg  oxycodone provided. Patient brought his own CPAP, [employer] called to clear for patient. Will continue to monitor.

## 2015-05-06 NOTE — Progress Notes (Signed)
Rt spoke with pt. Pt states he will ware his on home CPAP unit. Machine is out in car as of now pt will get some one to bring in later. Once pt gets machine BIOMED will be call to ck machine per protocol.

## 2015-05-06 NOTE — Brief Op Note (Addendum)
05/06/2015  9:33 AM  PATIENT:  Burnett Harry  64 y.o. male  PRE-OPERATIVE DIAGNOSIS:  LEFT KNEE DJD  POST-OPERATIVE DIAGNOSIS:  LEFT KNEE DJD  PROCEDURE:  Procedure(s): LEFT TOTAL KNEE ARTHROPLASTY (Left)  SURGEON:  Surgeon(s) and Role:    * Susa Day, MD - Primary  PHYSICIAN ASSISTANT:   ASSISTANTS: Bissell   ANESTHESIA:   general  EBL:  Total I/O In: 1000 [I.V.:1000] Out: 225 [Urine:225]  BLOOD ADMINISTERED:none  DRAINS: none   LOCAL MEDICATIONS USED:  MARCAINE     SPECIMEN:  No Specimen  DISPOSITION OF SPECIMEN:  N/A  COUNTS:  YES  TOURNIQUET:   Total Tourniquet Time Documented: Thigh (Left) - 71 minutes Total: Thigh (Left) - 71 minutes   DICTATION: .Other Dictation: Dictation Number  [id number]  PLAN OF CARE: Admit to inpatient   PATIENT DISPOSITION:  PACU - hemodynamically stable.   Delay start of Pharmacological VTE agent (>24hrs) due to surgical blood loss or risk of bleeding: no

## 2015-05-06 NOTE — Progress Notes (Signed)
Bleeding noted through aquacel and compression wrap. Minimal amount of bleeding noted. Dee RN and myself removed the dressings, cleaned the area and replaced the dressings. Staples in place and intact. Patient tolerated well.

## 2015-05-06 NOTE — Transfer of Care (Signed)
Immediate Anesthesia Transfer of Care Note  Patient: Jeffrey Banks  Procedure(s) Performed: Procedure(s): LEFT TOTAL KNEE ARTHROPLASTY (Left)  Patient Location: PACU  Anesthesia Type:MAC and Spinal  Level of Consciousness: awake, alert  and oriented  Airway & Oxygen Therapy: Patient Spontanous Breathing and Patient connected to face mask oxygen  Post-op Assessment: Report given to RN and Post -op Vital signs reviewed and stable  Post vital signs: Reviewed and stable  Last Vitals:  Filed Vitals:   05/06/15 0548  BP: 139/68  Pulse: 68  Temp: 36.7 C  Resp: 16    Complications: No apparent anesthesia complications

## 2015-05-07 ENCOUNTER — Encounter (HOSPITAL_COMMUNITY): Payer: Self-pay | Admitting: Specialist

## 2015-05-07 LAB — GLUCOSE, CAPILLARY
Glucose-Capillary: 128 mg/dL — ABNORMAL HIGH (ref 65–99)
Glucose-Capillary: 117 mg/dL — ABNORMAL HIGH (ref 65–99)
Glucose-Capillary: 106 mg/dL — ABNORMAL HIGH (ref 65–99)
Glucose-Capillary: 105 mg/dL — ABNORMAL HIGH (ref 65–99)

## 2015-05-07 LAB — BASIC METABOLIC PANEL
Anion gap: 6 (ref 5–15)
BUN: 14 mg/dL (ref 6–20)
CALCIUM: 8.3 mg/dL — AB (ref 8.9–10.3)
CHLORIDE: 102 mmol/L (ref 101–111)
CO2: 29 mmol/L (ref 22–32)
Creatinine, Ser: 0.95 mg/dL (ref 0.61–1.24)
GFR calc non Af Amer: >60 mL/min (ref >60)
Glucose, Bld: 131 mg/dL — ABNORMAL HIGH (ref 65–99)
Potassium: 3.9 mmol/L (ref 3.5–5.1)
SODIUM: 137 mmol/L (ref 135–145)

## 2015-05-07 LAB — CBC
HEMATOCRIT: 40.2 % (ref 39.0–52.0)
HEMOGLOBIN: 13.3 g/dL (ref 13.0–17.0)
MCH: 29 pg (ref 26.0–34.0)
MCHC: 33.1 g/dL (ref 30.0–36.0)
MCV: 87.6 fL (ref 78.0–100.0)
Platelets: 242 10*3/uL (ref 150–400)
RBC: 4.59 MIL/uL (ref 4.22–5.81)
RDW: 12.9 % (ref 11.5–15.5)
WBC: 8.1 10*3/uL (ref 4.0–10.5)

## 2015-05-07 MED ORDER — OXYCODONE-ACETAMINOPHEN 5-325 MG PO TABS: 1.0000 | ORAL_TABLET | ORAL | Status: DC | PRN

## 2015-05-07 NOTE — Progress Notes (Signed)
Physical Therapy Treatment Patient Details Name: Jeffrey Banks MRN: [id number] DOB: 1950/10/18 Today's Date: 05/07/2015    History of Present Illness L TKR    PT Comments    Pt motivated and progressing with mobility but limited by pain/fatigue with mobility.  Follow Up Recommendations  Home health PT     Equipment Recommendations  None recommended by PT    Recommendations for Other Services OT consult     Precautions / Restrictions Precautions Precautions: Knee;Fall Required Braces or Orthoses: Knee Immobilizer - Left Knee Immobilizer - Left: Discontinue once straight leg raise with < 10 degree lag Restrictions Weight Bearing Restrictions: No LLE Weight Bearing: Weight bearing as tolerated    Mobility  Bed Mobility Overal bed mobility: Needs Assistance Bed Mobility: Sit to Supine       Sit to supine: Min assist   General bed mobility comments: cues for sequence and use of R LE to self assist.  Pt utilizing leg lifter with cues for technique  Transfers Overall transfer level: Needs assistance Equipment used: Rolling walker (2 wheeled) Transfers: Sit to/from Stand Sit to Stand: Min assist         General transfer comment: cues for UE/LE placement  Ambulation/Gait Ambulation/Gait assistance: Min assist Ambulation Distance (Feet): 44 Feet (and 7' from chair to bed) Assistive device: Rolling walker (2 wheeled) Gait Pattern/deviations: Step-to pattern;Decreased step length - right;Decreased step length - left;Shuffle;Trunk flexed Gait velocity: decreased   General Gait Details: cues for sequence, posture and position from RW; ltd by fatigue and increasing pain   Stairs            Wheelchair Mobility    Modified Rankin (Stroke Patients Only)       Balance                                    Cognition Arousal/Alertness: Awake/alert Behavior During Therapy: WFL for tasks assessed/performed Overall Cognitive Status: Within  Functional Limits for tasks assessed                      Exercises      General Comments        Pertinent Vitals/Pain Pain Assessment: 0-10 Pain Score: 6  Pain Location: L knee Pain Descriptors / Indicators: Aching;Sore Pain Intervention(s): Limited activity within patient's tolerance;Monitored during session;Premedicated before session;Ice applied    Home Living Family/patient expects to be discharged to:: Private residence Living Arrangements: Spouse/significant other Available Help at Discharge: Family         Home Equipment: Bedside commode;Shower seat      Prior Function Level of Independence: Independent;Independent with assistive device(s)          PT Goals (current goals can now be found in the care plan section) Acute Rehab PT Goals Patient Stated Goal: resume previous lifestyle with decreased pain PT Goal Formulation: With patient Time For Goal Achievement: 05/14/15 Potential to Achieve Goals: Good Progress towards PT goals: Progressing toward goals    Frequency  7X/week    PT Plan Current plan remains appropriate    Co-evaluation             End of Session Equipment Utilized During Treatment: Gait belt;Left knee immobilizer Activity Tolerance: Patient tolerated treatment well Patient left: in bed;with call bell/phone within reach;with family/visitor present     Time: 1400-1433 PT Time Calculation (min) (ACUTE ONLY): 33 min  Charges:  $Gait  Training: 23-37 mins                    G Codes:      Shon Indelicato May 08, 2015, 2:42 PM

## 2015-05-07 NOTE — Care Management Note (Addendum)
Case Management Note  Patient Details  Name: ONIX JUMPER MRN: [id number] Date of Birth: Aug 04, 1951  Subjective/Objective:  64 y.o. M admitted for L TKA 05/06/15. Anticipate Home with HHPT Sunday. No anticipated DME. Left message with answering service  for PA-C to enter Face to face and orders so arrangements can be made. Will leave pt with choices for [facility] agencies available.                  Action/Plan: HHPT. No other HH needs anticipated.    Expected Discharge Date:                  Expected Discharge Plan:     In-House Referral:     Discharge planning Services     Post Acute Care Choice:    Choice offered to:     DME Arranged:    DME Agency:     HH Arranged:    [place] Agency:     Status of Service:     Medicare Important Message Given:  Yes-second notification given Date Medicare IM Given:    Medicare IM give by:    Date Additional Medicare IM Given:    Additional Medicare Important Message give by:     If discussed at [place] of Stay Meetings, dates discussed:    Additional Comments:  Delrae Sawyers, RN 05/07/2015, 3:37 PM

## 2015-05-07 NOTE — Evaluation (Signed)
Occupational Therapy Evaluation Patient Details Name: Jeffrey Banks MRN: [id number] DOB: December 29, 1950 Today's Date: 05/07/2015    History of Present Illness L TKR   Clinical Impression   This 64 year old man was admitted for the above surgery.  Will follow in acute to further educate on bathroom transfers as pt's pain was high during evaluation.  Goals are for min guard level overall.  Pt currently needs min A for sit to stand.  He will have assistance with adls at home    Follow Up Recommendations  Supervision/Assistance - 24 hour    Equipment Recommendations  None recommended by OT    Recommendations for Other Services       Precautions / Restrictions Precautions Precautions: Knee;Fall      Mobility Bed Mobility               General bed mobility comments: oob  Transfers   Equipment used: Rolling walker (2 wheeled) Transfers: Sit to/from Stand Sit to Stand: Min assist         General transfer comment: cues for UE/LE placement    Balance                                            ADL Overall ADL's : Needs assistance/impaired     Grooming: Set up;Sitting   Upper Body Bathing: Set up;Sitting   Lower Body Bathing: Sit to/from stand;Minimal assistance   Upper Body Dressing : Set up;Sitting   Lower Body Dressing: Maximal assistance;Sit to/from stand                 General ADL Comments: donned shorts with mod A.  Pt has reach to hook pants over foot but he would have to hook R foot under L ankle to clear.  he will have assistance.  pt will try leg lifter with PT later this pm  Will return tomorrow to review bathroom transfers due to high pain with weightbearing this session     Vision     Perception     Praxis      Pertinent Vitals/Pain Pain Score: 9  (5 seated; 9 weightbearing) Pain Location: L knee/thigh Pain Descriptors / Indicators: Aching Pain Intervention(s): Limited activity within patient's tolerance;Monitored  during session;Premedicated before session;Repositioned;Ice applied     Hand Dominance     Extremity/Trunk Assessment Upper Extremity Assessment Upper Extremity Assessment: Overall WFL for tasks assessed           Communication Communication Communication: No difficulties   Cognition Arousal/Alertness: Awake/alert Behavior During Therapy: WFL for tasks assessed/performed Overall Cognitive Status: Within Functional Limits for tasks assessed                     General Comments       Exercises       Shoulder Instructions      Home Living Family/patient expects to be discharged to:: Private residence Living Arrangements: Spouse/significant other Available Help at Discharge: Family               Bathroom Shower/Tub: Walk-in Corporate treasurer Toilet: Handicapped height     Home Equipment: Bedside commode;Shower seat          Prior Functioning/Environment Level of Independence: Independent;Independent with assistive device(s)             OT Diagnosis: Acute pain  OT Problem List: Decreased activity tolerance;Decreased knowledge of use of DME or AE;Pain   OT Treatment/Interventions: Self-care/ADL training;DME and/or AE instruction;Patient/family education    OT Goals(Current goals can be found in the care plan section) Acute Rehab OT Goals Patient Stated Goal: resume previous lifestyle with decreased pain ADL Goals Pt Will Perform Grooming: with supervision;standing Pt Will Transfer to Toilet: with min guard assist;ambulating;bedside commode Pt Will Perform Tub/Shower Transfer: Shower transfer;with min guard assist;ambulating;shower seat  OT Frequency: Min 2X/week   Barriers to D/C:            Co-evaluation              End of Session    Activity Tolerance: Patient tolerated treatment well Patient left: in chair;with call bell/phone within reach;with family/visitor present   Time: 1638-4536 OT Time Calculation (min): 18  min Charges:  OT General Charges $OT Visit: 1 Procedure OT Evaluation $Initial OT Evaluation Tier I: 1 Procedure G-Codes:    Hamlet Lasecki 05/29/2015, 2:30 PM  Lesle Chris, OTR/L [phone] 05-29-15

## 2015-05-07 NOTE — Progress Notes (Signed)
Patient ID: Jeffrey Banks, male   DOB: [birth year], 64 y.o.   MRN: [id number] Subjective: 1 Day Post-Op Procedure(s) (LRB): LEFT TOTAL KNEE ARTHROPLASTY (Left) Patient reports pain as moderate.    Patient has complaints of L knee and thigh pain  We will start therapy today. Plan is to go home with HHPT after hospital stay. Reports pain L knee, fairly well controlled with pain meds, Dilaudid made him "loopy" last night, did still get some sleep. Bled through dressing s/p CPM which was changed yesterday evening and has remained dry. He reports some soreness in the left thigh as well. Yesterday he felt some slight nausea which did not progress and has resolved. Foley removed this AM.  Objective: Vital signs in last 24 hours: Temp:  [97.1 F (36.2 C)-100.7 F (38.2 C)] 98.8 F (37.1 C) (07/22 0524) Pulse Rate:  [53-78] 78 (07/22 0524) Resp:  [13-18] 16 (07/22 0524) BP: (113-145)/(54-89) 145/70 mmHg (07/22 0524) SpO2:  [95 %-100 %] 96 % (07/22 0524)  Intake/Output from previous day:  Intake/Output Summary (Last 24 hours) at 05/07/15 0817 Last data filed at 05/07/15 0757  Gross per 24 hour  Intake   3095 ml  Output   2825 ml  Net    270 ml    Intake/Output this shift: Total I/O In: 240 [P.O.:240] Out: -   Labs: Results for orders placed or performed during the hospital encounter of 05/06/15  Glucose, capillary  Result Value Ref Range   Glucose-Capillary 121 (H) 65 - 99 mg/dL   Comment 1 Notify RN   Glucose, capillary  Result Value Ref Range   Glucose-Capillary 112 (H) 65 - 99 mg/dL  Glucose, capillary  Result Value Ref Range   Glucose-Capillary 119 (H) 65 - 99 mg/dL  Glucose, capillary  Result Value Ref Range   Glucose-Capillary 139 (H) 65 - 99 mg/dL  CBC  Result Value Ref Range   WBC 8.1 4.0 - 10.5 K/uL   RBC 4.59 4.22 - 5.81 MIL/uL   Hemoglobin 13.3 13.0 - 17.0 g/dL   HCT 40.2 39.0 - 52.0 %   MCV 87.6 78.0 - 100.0 fL   MCH 29.0 26.0 - 34.0 pg   MCHC 33.1 30.0 - 36.0  g/dL   RDW 12.9 11.5 - 15.5 %   Platelets 242 150 - 400 K/uL  Basic metabolic panel  Result Value Ref Range   Sodium 137 135 - 145 mmol/L   Potassium 3.9 3.5 - 5.1 mmol/L   Chloride 102 101 - 111 mmol/L   CO2 29 22 - 32 mmol/L   Glucose, Bld 131 (H) 65 - 99 mg/dL   BUN 14 6 - 20 mg/dL   Creatinine, Ser 0.95 0.61 - 1.24 mg/dL   Calcium 8.3 (L) 8.9 - 10.3 mg/dL   GFR calc non Af Amer >60 >60 mL/min   GFR calc Af Amer >60 >60 mL/min   Anion gap 6 5 - 15  Glucose, capillary  Result Value Ref Range   Glucose-Capillary 98 65 - 99 mg/dL   Comment 1 Notify RN   Glucose, capillary  Result Value Ref Range   Glucose-Capillary 128 (H) 65 - 99 mg/dL  Type and screen  Result Value Ref Range   ABO/RH(D) O POS    Antibody Screen NEG    Sample Expiration 05/09/2015   ABO/Rh  Result Value Ref Range   ABO/RH(D) O POS     Exam - Neurologically intact ABD soft Neurovascular intact Sensation intact distally Intact pulses  distally Dorsiflexion/Plantar flexion intact Incision: dressing C/D/I and scant drainage No cellulitis present Compartment soft no sign of DVT Dressing - clean, dry, minimal dried bloody drainage distal aspect of aquacel dressing Motor function intact - moving foot and toes well on exam.   Assessment/Plan: 1 Day Post-Op Procedure(s) (LRB): LEFT TOTAL KNEE ARTHROPLASTY (Left)  Advance diet Up with therapy D/C IV fluids Past Medical History  Diagnosis Date  . Rhinitis   . Cough   . Depression   . Hyperlipidemia   . Impaired fasting glucose 2014  . Stenosis of cervical spine   . Back pain 2009  . OSA (obstructive sleep apnea) 2006    on CPAP does not know settings   . Diabetes mellitus without complication     borderline   . Arthritis     DVT Prophylaxis - Xarelto Protocol Weight-Bearing as tolerated to Left leg No vaccines. If unable to void 6 h s/p foley removal, bladder scan, straight cath prn Up with PT today Plan D/C home with HHPT when ready,  likely Sunday Will discuss with Dr. Mliss Fritz, [place] Rolls. 05/07/2015, 8:17 AM

## 2015-05-07 NOTE — Care Management Note (Deleted)
Case Management Note  Patient Details  Name: Jeffrey Banks MRN: [id number] Date of Birth: [birth year]  Subjective/Objective:                    Action/Plan:   Expected Discharge Date:                  Expected Discharge Plan:  [place]  In-House Referral:     Discharge planning Services  CM Consult  Post Acute Care Choice:  Home Health Choice offered to:  Patient  DME Arranged:    DME Agency:   (No DME needed)  HH Arranged:  PT HH Agency:  [facility]  Status of Service:  In process, will continue to follow  Medicare Important Message Given:  Yes-second notification given Date Medicare IM Given:    Medicare IM give by:    Date Additional Medicare IM Given:    Additional Medicare Important Message give by:     If discussed at [place] of Stay Meetings, dates discussed:    Additional Comments:  Delrae Sawyers, RN 05/07/2015, 4:03 PM

## 2015-05-07 NOTE — Care Management Important Message (Signed)
Important Message  Patient Details  Name: Jeffrey Banks MRN: [id number] Date of Birth: 09-22-1951   Medicare Important Message Given:  Yes-second notification given    Shelda Altes 05/07/2015, 2:38 [place] Message  Patient Details  Name: Jeffrey Banks MRN: [id number] Date of Birth: [birth year]   Medicare Important Message Given:  Yes-second notification given    Shelda Altes 05/07/2015, 2:38 PM

## 2015-05-07 NOTE — Progress Notes (Signed)
CSW received consult for SNF placement, though per PT recommendation - patient will return home with home health services. RNCM to arrange.   No further CSW needs identified - CSW signing off.   Raynaldo Opitz, [facility] Clinical Social Worker cell #: [phone]

## 2015-05-07 NOTE — Discharge Instructions (Addendum)
Elevate leg above heart 6x a day for 96minutes each Use knee immobilizer while walking until can SLR x 10 Use knee immobilizer in bed to keep knee in extension Aquacel dressing may remain in place until follow up. May shower with aquacel dressing in place. If the dressing becomes saturated or peels off, you may remove aquacel dressing. Place new dressing with gauze and tape or ACE bandage which should be kept clean and dry and changed daily. Follow up with Dr. Tonita Cong 2 weeks post-op to remove staples and take xrays  INSTRUCTIONS AFTER JOINT REPLACEMENT   o Remove items at home which could result in a fall. This includes throw rugs or furniture in walking pathways o ICE to the affected joint every three hours while awake for 30 minutes at a time, for at least the first 3-5 days, and then as needed for pain and swelling.  Continue to use ice for pain and swelling. You may notice swelling that will progress down to the foot and ankle.  This is normal after surgery.  Elevate your leg when you are not up walking on it.   o Continue to use the breathing machine you got in the hospital (incentive spirometer) which will help keep your temperature down.  It is common for your temperature to cycle up and down following surgery, especially at night when you are not up moving around and exerting yourself.  The breathing machine keeps your lungs expanded and your temperature down.   DIET:  As you were doing prior to hospitalization, we recommend a well-balanced diet.  DRESSING / WOUND CARE / SHOWERING  Keep the surgical dressing until follow up.  The dressing is water proof, so you can shower without any extra covering.  IF THE DRESSING FALLS OFF or the wound gets wet inside, change the dressing with sterile gauze.  Please use good hand washing techniques before changing the dressing.  Do not use any lotions or creams on the incision until instructed by your surgeon.    ACTIVITY  o Increase activity slowly as  tolerated, but follow the weight bearing instructions below.   o No driving for 6 weeks or until further direction given by your physician.  You cannot drive while taking narcotics.  o No lifting or carrying greater than 10 lbs. until further directed by your surgeon. o Avoid periods of inactivity such as sitting longer than an hour when not asleep. This helps prevent blood clots.  o You may return to work once you are authorized by your doctor.     WEIGHT BEARING   Weight bearing as tolerated with assist device (walker, cane, etc) as directed, use it as long as suggested by your surgeon or therapist, typically at least 4-6 weeks.   EXERCISES  Results after joint replacement surgery are often greatly improved when you follow the exercise, range of motion and muscle strengthening exercises prescribed by your doctor. Safety measures are also important to protect the joint from further injury. Any time any of these exercises cause you to have increased pain or swelling, decrease what you are doing until you are comfortable again and then slowly increase them. If you have problems or questions, call your caregiver or physical therapist for advice.   Rehabilitation is important following a joint replacement. After just a few days of immobilization, the muscles of the leg can become weakened and shrink (atrophy).  These exercises are designed to build up the tone and strength of the thigh and leg  muscles and to improve motion. Often times heat used for twenty to thirty minutes before working out will loosen up your tissues and help with improving the range of motion but do not use heat for the first two weeks following surgery (sometimes heat can increase post-operative swelling).   These exercises can be done on a training (exercise) mat, on the floor, on a table or on a bed. Use whatever works the best and is most comfortable for you.    Use music or television while you are exercising so that the  exercises are a pleasant break in your day. This will make your life better with the exercises acting as a break in your routine that you can look forward to.   Perform all exercises about fifteen times, three times per day or as directed.  You should exercise both the operative leg and the other leg as well.  Exercises include:    Quad Sets - Tighten up the muscle on the front of the thigh (Quad) and hold for 5-10 seconds.    Straight Leg Raises - With your knee straight (if you were given a brace, keep it on), lift the leg to 60 degrees, hold for 3 seconds, and slowly lower the leg.  Perform this exercise against resistance later as your leg gets stronger.   Leg Slides: Lying on your back, slowly slide your foot toward your buttocks, bending your knee up off the floor (only go as far as is comfortable). Then slowly slide your foot back down until your leg is flat on the floor again.   Angel Wings: Lying on your back spread your legs to the side as far apart as you can without causing discomfort.   Hamstring Strength:  Lying on your back, push your heel against the floor with your leg straight by tightening up the muscles of your buttocks.  Repeat, but this time bend your knee to a comfortable angle, and push your heel against the floor.  You may put a pillow under the heel to make it more comfortable if necessary.   A rehabilitation program following joint replacement surgery can speed recovery and prevent re-injury in the future due to weakened muscles. Contact your doctor or a physical therapist for more information on knee rehabilitation.    CONSTIPATION  Constipation is defined medically as fewer than three stools per week and severe constipation as less than one stool per week.  Even if you have a regular bowel pattern at home, your normal regimen is likely to be disrupted due to multiple reasons following surgery.  Combination of anesthesia, postoperative narcotics, change in appetite and  fluid intake all can affect your bowels.   YOU MUST use at least one of the following options; they are listed in order of increasing strength to get the job done.  They are all available over the counter, and you may need to use some, POSSIBLY even all of these options:    Drink plenty of fluids (prune juice may be helpful) and high fiber foods Colace 100 mg by mouth twice a day  Senokot for constipation as directed and as needed Dulcolax (bisacodyl), take with full glass of water  Miralax (polyethylene glycol) once or twice a day as needed.  If you have tried all these things and are unable to have a bowel movement in the first 3-4 days after surgery call either your surgeon or your primary doctor.    If you experience loose stools or diarrhea,  hold the medications until you stool forms back up.  If your symptoms do not get better within 1 week or if they get worse, check with your doctor.  If you experience "the worst abdominal pain ever" or develop nausea or vomiting, please contact the office immediately for further recommendations for treatment.   ITCHING:  If you experience itching with your medications, try taking only a single pain pill, or even half a pain pill at a time.  You can also use Benadryl over the counter for itching or also to help with sleep.   Nalin HOSE STOCKINGS:  Use stockings on both legs until for at least 2 weeks or as directed by physician office. They may be removed at night for sleeping.  MEDICATIONS:  See your medication summary on the After Visit Summary that nursing will review with you.  You may have some home medications which will be placed on hold until you complete the course of blood thinner medication.  It is important for you to complete the blood thinner medication as prescribed.  PRECAUTIONS:  If you experience chest pain or shortness of breath - call 911 immediately for transfer to the hospital emergency department.   If you develop a fever greater  that 101 F, purulent drainage from wound, increased redness or drainage from wound, foul odor from the wound/dressing, or calf pain - CONTACT YOUR SURGEON.                                                   FOLLOW-UP APPOINTMENTS:  If you do not already have a post-op appointment, please call the office for an appointment to be seen by your surgeon.  Guidelines for how soon to be seen are listed in your After Visit Summary, but are typically between 1-4 weeks after surgery.  OTHER INSTRUCTIONS:   Knee Replacement:  Do not place pillow under knee, focus on keeping the knee straight while resting. CPM instructions: 0-90 degrees, 2 hours in the morning, 2 hours in the afternoon, and 2 hours in the evening. Place foam block, curve side up under heel at all times except when in CPM or when walking.  DO NOT modify, tear, cut, or change the foam block in any way.  MAKE SURE YOU:   Understand these instructions.   Get help right away if you are not doing well or get worse.    Thank you for letting us be a part of your medical care team.  It is a privilege we respect greatly.  We hope these instructions will help you stay on track for a fast and full recovery!    Information on my medicine - XARELTO (Rivaroxaban)  This medication education was reviewed with me or my healthcare representative as part of my discharge preparation.  The pharmacist that spoke with me during my hospital stay was:  Biagio Borg, [facility]  Why was Xarelto prescribed for you? Xarelto was prescribed for you to reduce the risk of blood clots forming after orthopedic surgery. The medical term for these abnormal blood clots is venous thromboembolism (VTE).  What do you need to know about xarelto ? Take your Xarelto ONCE DAILY at the same time every day. You may take it either with or without food.  If you have difficulty swallowing the tablet whole, you may crush it and  mix in applesauce just prior to taking  your dose.  Take Xarelto exactly as prescribed by your doctor and DO NOT stop taking Xarelto without talking to the doctor who prescribed the medication.  Stopping without other VTE prevention medication to take the place of Xarelto may increase your risk of developing a clot.  After discharge, you should have regular check-up appointments with your healthcare provider that is prescribing your Xarelto.    What do you do if you miss a dose? If you miss a dose, take it as soon as you remember on the same day then continue your regularly scheduled once daily regimen the next day. Do not take two doses of Xarelto on the same day.   Important Safety Information A possible side effect of Xarelto is bleeding. You should call your healthcare provider right away if you experience any of the following: ? Bleeding from an injury or your nose that does not stop. ? Unusual colored urine (red or dark brown) or unusual colored stools (red or black). ? Unusual bruising for unknown reasons. ? A serious fall or if you hit your head (even if there is no bleeding).  Some medicines may interact with Xarelto and might increase your risk of bleeding while on Xarelto. To help avoid this, consult your healthcare provider or pharmacist prior to using any new prescription or non-prescription medications, including herbals, vitamins, non-steroidal anti-inflammatory drugs (NSAIDs) and supplements.  This website has more information on Xarelto: [email or web address]

## 2015-05-07 NOTE — Evaluation (Signed)
Physical Therapy Evaluation Patient Details Name: Jeffrey Banks MRN: [id number] DOB: 08-12-1951 Today's Date: 05/07/2015   History of Present Illness  L TKR  Clinical Impression  Pt s/p L TKR presents with decreased L LE strength/ROM and post op pain limiting functional mobility.  Pt should progress well to dc home with family assist and HHPT follow up.    Follow Up Recommendations Home health PT    Equipment Recommendations  None recommended by PT    Recommendations for Other Services OT consult     Precautions / Restrictions Precautions Precautions: Knee;Fall Restrictions Weight Bearing Restrictions: Yes LLE Weight Bearing: Weight bearing as tolerated      Mobility  Bed Mobility Overal bed mobility: Needs Assistance Bed Mobility: Supine to Sit     Supine to sit: Min assist;Mod assist     General bed mobility comments: Cues for sequence and use of R LE to self assist  Transfers Overall transfer level: Needs assistance Equipment used: Rolling walker (2 wheeled) Transfers: Sit to/from Stand Sit to Stand: Min assist;From elevated surface         General transfer comment: cues for LE management and use of UEs to self assist  Ambulation/Gait Ambulation/Gait assistance: Min assist Ambulation Distance (Feet): 24 Feet Assistive device: Rolling walker (2 wheeled) Gait Pattern/deviations: Step-to pattern;Shuffle;Trunk flexed Gait velocity: decreased   General Gait Details: cues for sequence, posture and position from [employer]            Wheelchair Mobility    Modified Rankin (Stroke Patients Only)       Balance                                             Pertinent Vitals/Pain Pain Assessment: 0-10 Pain Score: 5  Pain Location: L knee/thigh Pain Descriptors / Indicators: Aching;Sore Pain Intervention(s): Limited activity within patient's tolerance;Monitored during session;Premedicated before session;Ice applied    Home  Living Family/patient expects to be discharged to:: Private residence Living Arrangements: Spouse/significant other Available Help at Discharge: Family Type of Home: House Home Access: Stairs to enter Entrance Stairs-Rails: None Entrance Stairs-Number of Steps: 3 (has pull bars) Home Layout: One level Home Equipment: Walker - 2 wheels;Crutches;Cane - single point;Toilet riser      Prior Function Level of Independence: Independent;Independent with assistive device(s)               Hand Dominance        Extremity/Trunk Assessment   Upper Extremity Assessment: Overall WFL for tasks assessed           Lower Extremity Assessment: LLE deficits/detail   LLE Deficits / Details: 2/5 quads with AAROM at knee -10-  45  Cervical / Trunk Assessment: Normal  Communication   Communication: No difficulties  Cognition Arousal/Alertness: Awake/alert Behavior During Therapy: WFL for tasks assessed/performed Overall Cognitive Status: Within Functional Limits for tasks assessed                      General Comments      Exercises Total Joint Exercises Ankle Circles/Pumps: AROM;Left;10 reps;Supine Quad Sets: AROM;Both;10 reps;Supine Heel Slides: AAROM;Left;10 reps;Supine Straight Leg Raises: AAROM;Left;10 reps;Supine      Assessment/Plan    PT Assessment Patient needs continued PT services  PT Diagnosis Difficulty walking   PT Problem List Decreased strength;Decreased range of motion;Decreased activity tolerance;Decreased mobility;Decreased  knowledge of use of DME;Pain;Obesity  PT Treatment Interventions DME instruction;Gait training;Stair training;Functional mobility training;Therapeutic activities;Therapeutic exercise;Patient/family education   PT Goals (Current goals can be found in the Care Plan section) Acute Rehab PT Goals Patient Stated Goal: resume previous lifestyle with decreased pain PT Goal Formulation: With patient Time For Goal Achievement:  05/14/15 Potential to Achieve Goals: Good    Frequency 7X/week   Barriers to discharge        Co-evaluation               End of Session Equipment Utilized During Treatment: Gait belt;Left knee immobilizer Activity Tolerance: Patient tolerated treatment well Patient left: in chair;with call bell/phone within reach Nurse Communication: Mobility status         Time: 2876-8115 PT Time Calculation (min) (ACUTE ONLY): 39 min   Charges:   PT Evaluation $Initial PT Evaluation Tier I: 1 Procedure PT Treatments $Gait Training: 8-22 mins $Therapeutic Exercise: 8-22 mins   PT G Codes:        Rayola Everhart 05/16/15, 12:55 PM

## 2015-05-08 LAB — CBC
HCT: 39.2 % (ref 39.0–52.0)
Hemoglobin: 12.9 g/dL — ABNORMAL LOW (ref 13.0–17.0)
MCH: 28.7 pg (ref 26.0–34.0)
MCHC: 32.9 g/dL (ref 30.0–36.0)
MCV: 87.3 fL (ref 78.0–100.0)
PLATELETS: 225 10*3/uL (ref 150–400)
RBC: 4.49 MIL/uL (ref 4.22–5.81)
RDW: 12.8 % (ref 11.5–15.5)
WBC: 8.7 10*3/uL (ref 4.0–10.5)

## 2015-05-08 LAB — GLUCOSE, CAPILLARY
GLUCOSE-CAPILLARY: 107 mg/dL — AB (ref 65–99)
GLUCOSE-CAPILLARY: 116 mg/dL — AB (ref 65–99)
Glucose-Capillary: 96 mg/dL (ref 65–99)
Glucose-Capillary: 128 mg/dL — ABNORMAL HIGH (ref 65–99)

## 2015-05-08 NOTE — Progress Notes (Signed)
Offered to assist pt with home cpap, but pt declined.  Patient's family member at bedside stated that they have enough water left from yesterday for humidifier.  RT will monitor and assess as needed.

## 2015-05-08 NOTE — Progress Notes (Signed)
Physical Therapy Treatment Patient Details Name: Jeffrey Banks MRN: [id number] DOB: 10-22-1950 Today's Date: 05/08/2015    History of Present Illness L TKR    PT Comments    Good progress with mobility.  Pt hopeful for dc tomorrow.  Follow Up Recommendations  Home health PT     Equipment Recommendations  None recommended by PT    Recommendations for Other Services OT consult     Precautions / Restrictions Precautions Precautions: Knee;Fall Required Braces or Orthoses: Knee Immobilizer - Left Knee Immobilizer - Left: Discontinue once straight leg raise with < 10 degree lag Restrictions Weight Bearing Restrictions: No LLE Weight Bearing: Weight bearing as tolerated    Mobility  Bed Mobility Overal bed mobility: Needs Assistance Bed Mobility: Supine to Sit       Sit to supine: Min assist   General bed mobility comments: cues for sequence and use of R LE to self assist.  Min assist with L LE  Transfers Overall transfer level: Needs assistance Equipment used: Rolling walker (2 wheeled) Transfers: Sit to/from Stand Sit to Stand: Min guard         General transfer comment: cues for LE placement; min guard for safety  Ambulation/Gait Ambulation/Gait assistance: Min assist;Min guard Ambulation Distance (Feet): 171 Feet Assistive device: Rolling walker (2 wheeled) Gait Pattern/deviations: Step-to pattern;Decreased step length - right;Decreased step length - left;Shuffle;Trunk flexed Gait velocity: decreased   General Gait Details: min cues for sequence, posture and position from [employer]            Wheelchair Mobility    Modified Rankin (Stroke Patients Only)       Balance                                    Cognition Arousal/Alertness: Awake/alert Behavior During Therapy: WFL for tasks assessed/performed Overall Cognitive Status: Within Functional Limits for tasks assessed                      Exercises Total Joint  Exercises Ankle Circles/Pumps: AROM;Left;Supine;15 reps Quad Sets: AROM;Both;Supine;15 reps Heel Slides: AAROM;Left;Supine;10 reps Straight Leg Raises: AAROM;Left;Supine;15 reps    General Comments        Pertinent Vitals/Pain Pain Assessment: 0-10 Pain Score: 6  Pain Location: L knee Pain Descriptors / Indicators: Aching;Sore Pain Intervention(s): Limited activity within patient's tolerance;Monitored during session;Premedicated before session;Ice applied    Home Living                      Prior Function            PT Goals (current goals can now be found in the care plan section) Acute Rehab PT Goals Patient Stated Goal: resume previous lifestyle with decreased pain PT Goal Formulation: With patient Time For Goal Achievement: 05/14/15 Potential to Achieve Goals: Good Progress towards PT goals: Progressing toward goals    Frequency  7X/week    PT Plan Current plan remains appropriate    Co-evaluation             End of Session Equipment Utilized During Treatment: Gait belt;Left knee immobilizer Activity Tolerance: Patient tolerated treatment well Patient left: in bed;with call bell/phone within reach;with family/visitor present     Time: 2620-3559 PT Time Calculation (min) (ACUTE ONLY): 42 min  Charges:  $Gait Training: 23-37 mins $Therapeutic Exercise: 8-22 mins  G Codes:      Mikeyla Music 05-31-2015, 4:58 PM

## 2015-05-08 NOTE — Progress Notes (Signed)
Patient ID: Jeffrey Banks, male   DOB: 1950-11-08, 64 y.o.   MRN: [id number] Subjective: 2 Days Post-Op Procedure(s) (LRB): LEFT TOTAL KNEE ARTHROPLASTY (Left) Patient reports pain as mild.    Patient has complaints of L knee pain, doing better today. Occasional lightheadedness otherwise feeling well. No nausea, calf pain, numbness, tingling.  Objective: Vital signs in last 24 hours: Temp:  [98.3 F (36.8 C)-101 F (38.3 C)] 98.3 F (36.8 C) (07/23 0405) Pulse Rate:  [81-90] 81 (07/23 0405) Resp:  [18] 18 (07/23 0405) BP: (134-149)/(64-72) 134/64 mmHg (07/23 0405) SpO2:  [94 %-99 %] 99 % (07/23 0405)  Intake/Output from previous day:  Intake/Output Summary (Last 24 hours) at 05/08/15 0930 Last data filed at 05/08/15 0400  Gross per 24 hour  Intake    480 ml  Output   1300 ml  Net   -820 ml    Intake/Output this shift:    Labs: Results for orders placed or performed during the hospital encounter of 05/06/15  Glucose, capillary  Result Value Ref Range   Glucose-Capillary 121 (H) 65 - 99 mg/dL   Comment 1 Notify RN   Glucose, capillary  Result Value Ref Range   Glucose-Capillary 112 (H) 65 - 99 mg/dL  Glucose, capillary  Result Value Ref Range   Glucose-Capillary 119 (H) 65 - 99 mg/dL  Glucose, capillary  Result Value Ref Range   Glucose-Capillary 139 (H) 65 - 99 mg/dL  CBC  Result Value Ref Range   WBC 8.1 4.0 - 10.5 K/uL   RBC 4.59 4.22 - 5.81 MIL/uL   Hemoglobin 13.3 13.0 - 17.0 g/dL   HCT 40.2 39.0 - 52.0 %   MCV 87.6 78.0 - 100.0 fL   MCH 29.0 26.0 - 34.0 pg   MCHC 33.1 30.0 - 36.0 g/dL   RDW 12.9 11.5 - 15.5 %   Platelets 242 150 - 400 K/uL  Basic metabolic panel  Result Value Ref Range   Sodium 137 135 - 145 mmol/L   Potassium 3.9 3.5 - 5.1 mmol/L   Chloride 102 101 - 111 mmol/L   CO2 29 22 - 32 mmol/L   Glucose, Bld 131 (H) 65 - 99 mg/dL   BUN 14 6 - 20 mg/dL   Creatinine, Ser 0.95 0.61 - 1.24 mg/dL   Calcium 8.3 (L) 8.9 - 10.3 mg/dL   GFR calc non  Af Amer >60 >60 mL/min   GFR calc Af Amer >60 >60 mL/min   Anion gap 6 5 - 15  Glucose, capillary  Result Value Ref Range   Glucose-Capillary 98 65 - 99 mg/dL   Comment 1 Notify RN   Glucose, capillary  Result Value Ref Range   Glucose-Capillary 128 (H) 65 - 99 mg/dL  Glucose, capillary  Result Value Ref Range   Glucose-Capillary 117 (H) 65 - 99 mg/dL  CBC  Result Value Ref Range   WBC 8.7 4.0 - 10.5 K/uL   RBC 4.49 4.22 - 5.81 MIL/uL   Hemoglobin 12.9 (L) 13.0 - 17.0 g/dL   HCT 39.2 39.0 - 52.0 %   MCV 87.3 78.0 - 100.0 fL   MCH 28.7 26.0 - 34.0 pg   MCHC 32.9 30.0 - 36.0 g/dL   RDW 12.8 11.5 - 15.5 %   Platelets 225 150 - 400 K/uL  Glucose, capillary  Result Value Ref Range   Glucose-Capillary 106 (H) 65 - 99 mg/dL  Glucose, capillary  Result Value Ref Range   Glucose-Capillary 105 (H) 65 -  99 mg/dL  Glucose, capillary  Result Value Ref Range   Glucose-Capillary 107 (H) 65 - 99 mg/dL  Type and screen  Result Value Ref Range   ABO/RH(D) O POS    Antibody Screen NEG    Sample Expiration 05/09/2015   ABO/Rh  Result Value Ref Range   ABO/RH(D) O POS     Exam - Neurologically intact ABD soft Neurovascular intact Sensation intact distally Intact pulses distally Dorsiflexion/Plantar flexion intact Incision: dressing C/D/I and scant drainage No cellulitis present Compartment soft no sign of DVT Dressing/Incision - clean, dry, minimal dried bloody drainage distal aspect of aquacel dressing, unchanged Motor function intact - moving foot and toes well on exam.  Swelling, ecchymosis L knee  Assessment/Plan: 2 Days Post-Op Procedure(s) (LRB): LEFT TOTAL KNEE ARTHROPLASTY (Left)  Advance diet Up with therapy Past Medical History  Diagnosis Date  . Rhinitis   . Cough   . Depression   . Hyperlipidemia   . Impaired fasting glucose 2014  . Stenosis of cervical spine   . Back pain 2009  . OSA (obstructive sleep apnea) 2006    on CPAP does not know settings   .  Diabetes mellitus without complication     borderline   . Arthritis     DVT Prophylaxis - Xarelto Protocol Weight-Bearing as tolerated to Left leg Ice and elevation, toes above the nose, to decrease swelling Removed ACE at L knee due to c/o itching- can place Casimiro stocking Plan D/C home tomorrow after more PT today as long as pain remains controlled  Gwen Sarvis M. 05/08/2015, 9:30 AM

## 2015-05-08 NOTE — Progress Notes (Signed)
Occupational Therapy Treatment Patient Details Name: Jeffrey Banks MRN: [id number] DOB: 09/14/1951 Today's Date: 05/08/2015    History of present illness L TKR   OT comments  Pt demonstrated/verbalized understanding of shower and toilet transfer.  Increased pain with shower transfer, which was a "10" but very short duration  Follow Up Recommendations  Supervision/Assistance - 24 hour    Equipment Recommendations  None recommended by OT    Recommendations for Other Services      Precautions / Restrictions Precautions Precautions: Knee;Fall Required Braces or Orthoses: Knee Immobilizer - Left Knee Immobilizer - Left: Discontinue once straight leg raise with < 10 degree lag Restrictions LLE Weight Bearing: Weight bearing as tolerated       Mobility Bed Mobility   Bed Mobility: Sit to Supine       Sit to supine: Min assist;HOB elevated   General bed mobility comments: tried sheet to manage LLE; had difficulty pushing up.  Assisted with LLE.  Pt did have a brief period of vertigo when pushing up to sitting which resolved  Transfers   Equipment used: Rolling walker (2 wheeled) Transfers: Sit to/from Stand Sit to Stand: Min guard         General transfer comment: cues for LE placement; min guard for safety    Balance                                   ADL                           Toilet Transfer: Min guard;Ambulation;BSC;RW       Tub/ Shower Transfer: Walk-in shower;Min guard;Ambulation;Rolling walker     General ADL Comments: ambulated to bathroom and practiced the above transfers.  Pt had high pain when stepping into shower stall despite taking weight through bil UEs.  when turning, pt tends to keep LLE NWB and pivots with R foot.  Self-cued for step length and walker distance and self-corrected.  Tried sheet to assist LLE OOB, but pt could not manage this while pushing up on arms.  He semi rolled to L side and had a brief period of  vertigo      Vision                     Perception     Praxis      Cognition   Behavior During Therapy: WFL for tasks assessed/performed Overall Cognitive Status: Within Functional Limits for tasks assessed                       Extremity/Trunk Assessment               Exercises     Shoulder Instructions       General Comments      Pertinent Vitals/ Pain       Pain Score: 5  (to 10; brief 10 when stepping into shower stall) Pain Location: L knee Pain Descriptors / Indicators: Aching Pain Intervention(s): Limited activity within patient's tolerance;Monitored during session;Premedicated before session;Repositioned;Ice applied  Home Living                                          Prior Functioning/Environment  Frequency       Progress Toward Goals  OT Goals(current goals can now be found in the care plan section)  Progress towards OT goals: Goals met/education completed, patient discharged from OT (did not perform grooming due to pain; pt verbalizes)     Plan      Co-evaluation                 End of Session     Activity Tolerance Patient tolerated treatment well   Patient Left in chair;with call bell/phone within reach;with family/visitor present   Nurse Communication          Time: 9539-6728 OT Time Calculation (min): 26 min  Charges: OT General Charges $OT Visit: 1 Procedure OT Treatments $Self Care/Home Management : 23-37 mins  Jeffrey Banks 05/08/2015, 10:05 AM   Lesle Chris, OTR/L [phone] 05/08/2015

## 2015-05-08 NOTE — Progress Notes (Signed)
Physical Therapy Treatment Patient Details Name: Jeffrey Banks MRN: [id number] DOB: [birth year] Today's Date: 05/08/2015    History of Present Illness L TKR    PT Comments    Good progress with mobility.  Pt hopeful for dc tomorrow  Follow Up Recommendations  Home health PT     Equipment Recommendations  None recommended by PT    Recommendations for Other Services OT consult     Precautions / Restrictions Precautions Precautions: Knee;Fall Required Braces or Orthoses: Knee Immobilizer - Left Knee Immobilizer - Left: Discontinue once straight leg raise with < 10 degree lag Restrictions Weight Bearing Restrictions: No LLE Weight Bearing: Weight bearing as tolerated    Mobility  Bed Mobility   Bed Mobility: Sit to Supine       Sit to supine: Min assist;HOB elevated   General bed mobility comments: OOB with OT - requests back to chair  Transfers Overall transfer level: Needs assistance Equipment used: Rolling walker (2 wheeled) Transfers: Sit to/from Stand Sit to Stand: Min guard         General transfer comment: cues for LE placement; min guard for safety  Ambulation/Gait Ambulation/Gait assistance: Min assist;Min guard Ambulation Distance (Feet): 123 Feet Assistive device: Rolling walker (2 wheeled) Gait Pattern/deviations: Step-to pattern;Decreased step length - right;Decreased step length - left;Shuffle;Trunk flexed Gait velocity: decreased   General Gait Details: cues for sequence, posture and position from RW; ltd by fatigue and increasing pain   Stairs            Wheelchair Mobility    Modified Rankin (Stroke Patients Only)       Balance                                    Cognition Arousal/Alertness: Awake/alert Behavior During Therapy: WFL for tasks assessed/performed Overall Cognitive Status: Within Functional Limits for tasks assessed                      Exercises Total Joint Exercises Ankle  Circles/Pumps: AROM;Left;Supine;15 reps Quad Sets: AROM;Both;Supine;15 reps Heel Slides: AAROM;Left;Supine;15 reps Straight Leg Raises: AAROM;Left;Supine;15 reps Goniometric ROM: AAROM at L knee -10 - 60    General Comments        Pertinent Vitals/Pain Pain Assessment: 0-10 Pain Score: 6  Pain Location: L knee Pain Descriptors / Indicators: Aching;Sore Pain Intervention(s): Limited activity within patient's tolerance;Monitored during session;Premedicated before session;Ice applied    Home Living                      Prior Function            PT Goals (current goals can now be found in the care plan section) Acute Rehab PT Goals Patient Stated Goal: resume previous lifestyle with decreased pain PT Goal Formulation: With patient Time For Goal Achievement: 05/14/15 Potential to Achieve Goals: Good Progress towards PT goals: Progressing toward goals    Frequency  7X/week    PT Plan Current plan remains appropriate    Co-evaluation             End of Session Equipment Utilized During Treatment: Gait belt;Left knee immobilizer Activity Tolerance: Patient tolerated treatment well Patient left: in chair;with call bell/phone within reach;with family/visitor present     Time: 1012-1050 PT Time Calculation (min) (ACUTE ONLY): 38 min  Charges:  $Gait Training: 8-22 mins $Therapeutic Exercise: 23-37 mins  G Codes:      Edana Aguado 2015/05/11, 1:02 PM

## 2015-05-09 LAB — CBC
HCT: 36.7 % — ABNORMAL LOW (ref 39.0–52.0)
HEMOGLOBIN: 11.9 g/dL — AB (ref 13.0–17.0)
MCH: 28.1 pg (ref 26.0–34.0)
MCHC: 32.4 g/dL (ref 30.0–36.0)
MCV: 86.6 fL (ref 78.0–100.0)
Platelets: 208 10*3/uL (ref 150–400)
RBC: 4.24 MIL/uL (ref 4.22–5.81)
RDW: 12.8 % (ref 11.5–15.5)
WBC: 7.6 10*3/uL (ref 4.0–10.5)

## 2015-05-09 LAB — GLUCOSE, CAPILLARY: Glucose-Capillary: 96 mg/dL (ref 65–99)

## 2015-05-09 NOTE — Progress Notes (Signed)
[facility] aware of scheduled dc home today with HHPT. Spoke to pt and has RW and 3n1 for home. Jonnie Finner RN CCM Case Mgmt phone [phone]

## 2015-05-09 NOTE — Progress Notes (Signed)
Physical Therapy Treatment Patient Details Name: Jeffrey Banks MRN: [id number] DOB: Apr 09, 1951 Today's Date: 05/09/2015    History of Present Illness L TKR    PT Comments    Progressing well.  Reviewed stairs, don/doff KI and therex for home  Follow Up Recommendations  Home health PT     Equipment Recommendations  None recommended by PT    Recommendations for Other Services OT consult     Precautions / Restrictions Precautions Precautions: Knee;Fall Required Braces or Orthoses: Knee Immobilizer - Left Knee Immobilizer - Left: Discontinue once straight leg raise with < 10 degree lag Restrictions Weight Bearing Restrictions: No LLE Weight Bearing: Weight bearing as tolerated    Mobility  Bed Mobility Overal bed mobility: Needs Assistance Bed Mobility: Supine to Sit     Supine to sit: Min assist     General bed mobility comments: oob  Transfers Overall transfer level: Needs assistance Equipment used: Rolling walker (2 wheeled) Transfers: Sit to/from Stand Sit to Stand: Supervision         General transfer comment: cues for LE placement  Ambulation/Gait Ambulation/Gait assistance: Min guard;Supervision Ambulation Distance (Feet): 66 Feet (and 40) Assistive device: Rolling walker (2 wheeled) Gait Pattern/deviations: Step-to pattern;Shuffle;Trunk flexed Gait velocity: decreased   General Gait Details: min cues posture and position from RW   Stairs Stairs: Yes Stairs assistance: Min assist Stair Management: No rails;Step to pattern;Forwards;With crutches Number of Stairs: 8 (4 steps twice) General stair comments: cues for sequence and foot/crutch placement.  Min assist for balance  Wheelchair Mobility    Modified Rankin (Stroke Patients Only)       Balance                                    Cognition Arousal/Alertness: Awake/alert Behavior During Therapy: WFL for tasks assessed/performed Overall Cognitive Status: Within  Functional Limits for tasks assessed                      Exercises Total Joint Exercises Ankle Circles/Pumps: AROM;Left;Supine;15 reps Quad Sets: AROM;Both;Supine;20 reps Heel Slides: AAROM;Left;Supine;15 reps Straight Leg Raises: AAROM;Left;Supine;20 reps Goniometric ROM: AAROM at R knee -10 - 65    General Comments        Pertinent Vitals/Pain Pain Assessment: 0-10 Pain Score: 5  Pain Location: L knee Pain Descriptors / Indicators: Aching;Sore Pain Intervention(s): Limited activity within patient's tolerance;Monitored during session;Premedicated before session;Ice applied    Home Living                      Prior Function            PT Goals (current goals can now be found in the care plan section) Acute Rehab PT Goals Patient Stated Goal: resume previous lifestyle with decreased pain PT Goal Formulation: With patient Time For Goal Achievement: 05/14/15 Potential to Achieve Goals: Good Progress towards PT goals: Progressing toward goals    Frequency  7X/week    PT Plan Current plan remains appropriate    Co-evaluation             End of Session Equipment Utilized During Treatment: Gait belt;Left knee immobilizer Activity Tolerance: Patient tolerated treatment well Patient left: in chair;with call bell/phone within reach;with family/visitor present     Time: 0905-0928 PT Time Calculation (min) (ACUTE ONLY): 23 min  Charges:  $Gait Training: 8-22 mins $Therapeutic Exercise: 8-22 mins $Therapeutic  Activity: 8-22 mins                    G Codes:      Ardelia Wrede 06-07-2015, 10:33 AM

## 2015-05-09 NOTE — Progress Notes (Signed)
Discharged from floor via w/c, belongings & family with pt. No changes in assessment. Jeffrey Banks  

## 2015-05-09 NOTE — Progress Notes (Signed)
     Subjective: 3 Days Post-Op Procedure(s) (LRB): LEFT TOTAL KNEE ARTHROPLASTY (Left)   Patient reports pain as mild, pain controlled. No events throughout the night. Discussed the importance of therapy. Ready to be discharged home.   Objective:   VITALS:   Filed Vitals:   05/09/15 0630  BP: 135/58  Pulse: 80  Temp: 99.1 F (37.3 C)  Resp: 16    Dorsiflexion/Plantar flexion intact Incision: dressing C/D/I No cellulitis present Compartment soft  LABS  Recent Labs  05/07/15 0420 05/08/15 0514 05/09/15 0541  HGB 13.3 12.9* 11.9*  HCT 40.2 39.2 36.7*  WBC 8.1 8.7 7.6  PLT 242 225 208     Recent Labs  05/07/15 0420  NA 137  K 3.9  BUN 14  CREATININE 0.95  GLUCOSE 131*     Assessment/Plan: 3 Days Post-Op Procedure(s) (LRB): LEFT TOTAL KNEE ARTHROPLASTY (Left) Up with therapy Discharge home with home health  Follow up in 2 weeks at [facility]. Follow up with Dr Tonita Cong in 2 weeks.  Contact information:  [facility] [address] [place] K[place] Lamba   PAC  05/09/2015, 8:09 AM

## 2015-05-09 NOTE — Progress Notes (Signed)
Physical Therapy Treatment Patient Details Name: Jeffrey Banks MRN: [id number] DOB: 08/28/1951 Today's Date: 05-11-15    History of Present Illness L TKR    PT Comments    Progressing well with mobility.  Follow Up Recommendations  Home health PT     Equipment Recommendations  None recommended by PT    Recommendations for Other Services OT consult     Precautions / Restrictions Precautions Precautions: Knee;Fall Required Braces or Orthoses: Knee Immobilizer - Left Knee Immobilizer - Left: Discontinue once straight leg raise with < 10 degree lag Restrictions Weight Bearing Restrictions: No LLE Weight Bearing: Weight bearing as tolerated    Mobility  Bed Mobility Overal bed mobility: Needs Assistance Bed Mobility: Supine to Sit     Supine to sit: Min assist     General bed mobility comments: min assis with L LE   Transfers Overall transfer level: Needs assistance Equipment used: Rolling walker (2 wheeled) Transfers: Sit to/from Stand Sit to Stand: Supervision         General transfer comment: cues for LE placement  Ambulation/Gait Ambulation/Gait assistance: Min guard;Supervision Ambulation Distance (Feet): 75 Feet Assistive device: Rolling walker (2 wheeled) Gait Pattern/deviations: Step-to pattern;Shuffle;Trunk flexed Gait velocity: decreased   General Gait Details: min cues for sequence, posture and position from [employer]            Wheelchair Mobility    Modified Rankin (Stroke Patients Only)       Balance                                    Cognition Arousal/Alertness: Awake/alert Behavior During Therapy: WFL for tasks assessed/performed Overall Cognitive Status: Within Functional Limits for tasks assessed                      Exercises Total Joint Exercises Ankle Circles/Pumps: AROM;Left;Supine;15 reps Quad Sets: AROM;Both;Supine;20 reps Heel Slides: AAROM;Left;Supine;15 reps Straight Leg Raises:  AAROM;Left;Supine;20 reps Goniometric ROM: AAROM at R knee -10 - 65    General Comments        Pertinent Vitals/Pain Pain Assessment: 0-10 Pain Score: 6  Pain Location: L knee Pain Descriptors / Indicators: Aching;Sore Pain Intervention(s): Limited activity within patient's tolerance;Monitored during session;Premedicated before session    Home Living                      Prior Function            PT Goals (current goals can now be found in the care plan section) Acute Rehab PT Goals Patient Stated Goal: resume previous lifestyle with decreased pain PT Goal Formulation: With patient Time For Goal Achievement: 05/14/15 Potential to Achieve Goals: Good Progress towards PT goals: Progressing toward goals    Frequency  7X/week    PT Plan Current plan remains appropriate    Co-evaluation             End of Session Equipment Utilized During Treatment: Gait belt;Left knee immobilizer Activity Tolerance: Patient tolerated treatment well Patient left: in bed;with call bell/phone within reach;with family/visitor present     Time: 6468-0321 PT Time Calculation (min) (ACUTE ONLY): 33 min  Charges:  $Gait Training: 8-22 mins $Therapeutic Exercise: 8-22 mins                    G Codes:      Autry Droege 05-11-2015,  10:27 AM

## 2015-05-10 DIAGNOSIS — M4802 Spinal stenosis, cervical region: Secondary | ICD-10-CM | POA: Diagnosis not present

## 2015-05-10 DIAGNOSIS — M1990 Unspecified osteoarthritis, unspecified site: Secondary | ICD-10-CM | POA: Diagnosis not present

## 2015-05-10 DIAGNOSIS — G629 Polyneuropathy, unspecified: Secondary | ICD-10-CM | POA: Diagnosis not present

## 2015-05-10 DIAGNOSIS — Z87891 Personal history of nicotine dependence: Secondary | ICD-10-CM | POA: Diagnosis not present

## 2015-05-10 DIAGNOSIS — Z96652 Presence of left artificial knee joint: Secondary | ICD-10-CM | POA: Diagnosis not present

## 2015-05-10 DIAGNOSIS — Z471 Aftercare following joint replacement surgery: Secondary | ICD-10-CM | POA: Diagnosis not present

## 2015-05-10 NOTE — Discharge Summary (Signed)
Physician Discharge Summary   Patient ID: Jeffrey Banks MRN: [id number] DOB/AGE: 1951/09/25 64 y.o.  Admit date: 05/06/2015 Discharge date: 05/09/2015  Primary Diagnosis: left knee primary osteoarthritis  Admission Diagnoses:  Past Medical History  Diagnosis Date  . Rhinitis   . Cough   . Depression   . Hyperlipidemia   . Impaired fasting glucose 2014  . Stenosis of cervical spine   . Back pain 2009  . OSA (obstructive sleep apnea) 2006    on CPAP does not know settings   . Diabetes mellitus without complication     borderline   . Arthritis    Discharge Diagnoses:   Principal Problem:   Primary osteoarthritis of left knee Active Problems:   Left knee DJD  Estimated body mass index is 35.58 kg/(m^2) as calculated from the following:   Height as of this encounter: 5' 11"  (1.803 m).   Weight as of this encounter: 115.667 kg (255 lb).  Procedure:  Procedure(s) (LRB): LEFT TOTAL KNEE ARTHROPLASTY (Left)   Consults: None  HPI: see H&P Laboratory Data: Admission on 05/06/2015, Discharged on 05/09/2015  Component Date Value Ref Range Status  . Glucose-Capillary 05/06/2015 121* 65 - 99 mg/dL Final  . Comment 1 05/06/2015 Notify RN   Final  . ABO/RH(D) 05/06/2015 O POS   Final  . Antibody Screen 05/06/2015 NEG   Final  . Sample Expiration 05/06/2015 05/09/2015   Final  . ABO/RH(D) 05/06/2015 O POS   Final  . Glucose-Capillary 05/06/2015 112* 65 - 99 mg/dL Final  . Glucose-Capillary 05/06/2015 119* 65 - 99 mg/dL Final  . Glucose-Capillary 05/06/2015 139* 65 - 99 mg/dL Final  . WBC 05/07/2015 8.1  4.0 - 10.5 K/uL Final  . RBC 05/07/2015 4.59  4.22 - 5.81 MIL/uL Final  . Hemoglobin 05/07/2015 13.3  13.0 - 17.0 g/dL Final  . HCT 05/07/2015 40.2  39.0 - 52.0 % Final  . MCV 05/07/2015 87.6  78.0 - 100.0 fL Final  . MCH 05/07/2015 29.0  26.0 - 34.0 pg Final  . MCHC 05/07/2015 33.1  30.0 - 36.0 g/dL Final  . RDW 05/07/2015 12.9  11.5 - 15.5 % Final  . Platelets 05/07/2015 242   150 - 400 K/uL Final  . Sodium 05/07/2015 137  135 - 145 mmol/L Final  . Potassium 05/07/2015 3.9  3.5 - 5.1 mmol/L Final  . Chloride 05/07/2015 102  101 - 111 mmol/L Final  . CO2 05/07/2015 29  22 - 32 mmol/L Final  . Glucose, Bld 05/07/2015 131* 65 - 99 mg/dL Final  . BUN 05/07/2015 14  6 - 20 mg/dL Final  . Creatinine, Ser 05/07/2015 0.95  0.61 - 1.24 mg/dL Final  . Calcium 05/07/2015 8.3* 8.9 - 10.3 mg/dL Final  . GFR calc non Af Amer 05/07/2015 >60  >60 mL/min Final  . GFR calc Af Amer 05/07/2015 >60  >60 mL/min Final   Comment: (NOTE) The eGFR has been calculated using the CKD EPI equation. This calculation has not been validated in all clinical situations. eGFR's persistently <60 mL/min signify possible Chronic Kidney Disease.   . Anion gap 05/07/2015 6  5 - 15 Final  . Glucose-Capillary 05/06/2015 98  65 - 99 mg/dL Final  . Comment 1 05/06/2015 Notify RN   Final  . Glucose-Capillary 05/07/2015 128* 65 - 99 mg/dL Final  . Glucose-Capillary 05/07/2015 117* 65 - 99 mg/dL Final  . WBC 05/08/2015 8.7  4.0 - 10.5 K/uL Final  . RBC 05/08/2015 4.49  4.22 -  5.81 MIL/uL Final  . Hemoglobin 05/08/2015 12.9* 13.0 - 17.0 g/dL Final  . HCT 05/08/2015 39.2  39.0 - 52.0 % Final  . MCV 05/08/2015 87.3  78.0 - 100.0 fL Final  . MCH 05/08/2015 28.7  26.0 - 34.0 pg Final  . MCHC 05/08/2015 32.9  30.0 - 36.0 g/dL Final  . RDW 05/08/2015 12.8  11.5 - 15.5 % Final  . Platelets 05/08/2015 225  150 - 400 K/uL Final  . Glucose-Capillary 05/07/2015 106* 65 - 99 mg/dL Final  . Glucose-Capillary 05/07/2015 105* 65 - 99 mg/dL Final  . Glucose-Capillary 05/08/2015 107* 65 - 99 mg/dL Final  . Glucose-Capillary 05/08/2015 116* 65 - 99 mg/dL Final  . Glucose-Capillary 05/08/2015 96  65 - 99 mg/dL Final  . WBC 05/09/2015 7.6  4.0 - 10.5 K/uL Final  . RBC 05/09/2015 4.24  4.22 - 5.81 MIL/uL Final  . Hemoglobin 05/09/2015 11.9* 13.0 - 17.0 g/dL Final  . HCT 05/09/2015 36.7* 39.0 - 52.0 % Final  . MCV  05/09/2015 86.6  78.0 - 100.0 fL Final  . MCH 05/09/2015 28.1  26.0 - 34.0 pg Final  . MCHC 05/09/2015 32.4  30.0 - 36.0 g/dL Final  . RDW 05/09/2015 12.8  11.5 - 15.5 % Final  . Platelets 05/09/2015 208  150 - 400 K/uL Final  . Glucose-Capillary 05/08/2015 128* 65 - 99 mg/dL Final  . Glucose-Capillary 05/09/2015 96  65 - 99 mg/dL Final  Hospital Outpatient Visit on 04/27/2015  Component Date Value Ref Range Status  . Sodium 04/27/2015 139  135 - 145 mmol/L Final  . Potassium 04/27/2015 4.3  3.5 - 5.1 mmol/L Final  . Chloride 04/27/2015 103  101 - 111 mmol/L Final  . CO2 04/27/2015 27  22 - 32 mmol/L Final  . Glucose, Bld 04/27/2015 111* 65 - 99 mg/dL Final  . BUN 04/27/2015 24* 6 - 20 mg/dL Final  . Creatinine, Ser 04/27/2015 0.78  0.61 - 1.24 mg/dL Final  . Calcium 04/27/2015 9.4  8.9 - 10.3 mg/dL Final  . GFR calc non Af Amer 04/27/2015 >60  >60 mL/min Final  . GFR calc Af Amer 04/27/2015 >60  >60 mL/min Final   Comment: (NOTE) The eGFR has been calculated using the CKD EPI equation. This calculation has not been validated in all clinical situations. eGFR's persistently <60 mL/min signify possible Chronic Kidney Disease.   . Anion gap 04/27/2015 9  5 - 15 Final  . WBC 04/27/2015 7.4  4.0 - 10.5 K/uL Final  . RBC 04/27/2015 5.40  4.22 - 5.81 MIL/uL Final  . Hemoglobin 04/27/2015 15.7  13.0 - 17.0 g/dL Final  . HCT 04/27/2015 46.6  39.0 - 52.0 % Final  . MCV 04/27/2015 86.3  78.0 - 100.0 fL Final  . MCH 04/27/2015 29.1  26.0 - 34.0 pg Final  . MCHC 04/27/2015 33.7  30.0 - 36.0 g/dL Final  . RDW 04/27/2015 12.8  11.5 - 15.5 % Final  . Platelets 04/27/2015 257  150 - 400 K/uL Final  . Prothrombin Time 04/27/2015 13.5  11.6 - 15.2 seconds Final  . INR 04/27/2015 1.01  0.00 - 1.49 Final  . Color, Urine 04/27/2015 YELLOW  YELLOW Final  . APPearance 04/27/2015 CLEAR  CLEAR Final  . Specific Gravity, Urine 04/27/2015 1.024  1.005 - 1.030 Final  . pH 04/27/2015 6.5  5.0 - 8.0 Final    . Glucose, UA 04/27/2015 NEGATIVE  NEGATIVE mg/dL Final  . Hgb urine dipstick 04/27/2015 NEGATIVE  NEGATIVE Final  . Bilirubin Urine 04/27/2015 NEGATIVE  NEGATIVE Final  . Ketones, ur 04/27/2015 NEGATIVE  NEGATIVE mg/dL Final  . Protein, ur 04/27/2015 NEGATIVE  NEGATIVE mg/dL Final  . Urobilinogen, UA 04/27/2015 1.0  0.0 - 1.0 mg/dL Final  . Nitrite 04/27/2015 NEGATIVE  NEGATIVE Final  . Leukocytes, UA 04/27/2015 NEGATIVE  NEGATIVE Final   MICROSCOPIC NOT DONE ON URINES WITH NEGATIVE PROTEIN, BLOOD, LEUKOCYTES, NITRITE, OR GLUCOSE <1000 mg/dL.  Marland Kitchen aPTT 04/27/2015 27  24 - 37 seconds Final  . MRSA, PCR 04/27/2015 NEGATIVE  NEGATIVE Final  . Staphylococcus aureus 04/27/2015 NEGATIVE  NEGATIVE Final   Comment:        The Xpert SA Assay (FDA approved for NASAL specimens in patients over 14 years of age), is one component of a comprehensive surveillance program.  Test performance has been validated by [facility] for patients greater than or equal to 29 year old. It is not intended to diagnose infection nor to guide or monitor treatment.      X-Rays:Dg Knee 1-2 Views Left  04/27/2015   CLINICAL DATA:  Osteoarthritis.  EXAM: LEFT KNEE - 1-2 VIEW  COMPARISON:  None.  FINDINGS: Severe tricompartment degenerative change. Prominent medial joint space loss is noted. No acute abnormality identified.  IMPRESSION: Severe degenerative changes left knee with prominent joint space loss over the medial compartment.   Electronically Signed   By: Marcello Moores  Register   On: 04/27/2015 12:56   Dg Knee Left Port  05/06/2015   CLINICAL DATA:  Status post right total knee replacement.  EXAM: PORTABLE LEFT KNEE - 1-2 VIEW  COMPARISON:  April 27, 2015.  FINDINGS: The femoral and tibial components appear to be well situated. No acute fracture or dislocation is noted. Expected amount of postoperative soft tissue gas is noted anteriorly. Surgical staples are noted anteriorly.  IMPRESSION: Status post left total knee  arthroplasty.   Electronically Signed   By: Marijo Conception, M.D.   On: 05/06/2015 10:29    EKG: Orders placed or performed in visit on 02/02/10  . Converted [facility] EKG     Hospital Course: Jeffrey Banks is a 64 y.o. who was admitted to [facility]. They were brought to the operating room on 05/06/2015 and underwent Procedure(s): LEFT TOTAL KNEE ARTHROPLASTY.  Patient tolerated the procedure well and was later transferred to the recovery room and then to the orthopaedic floor for postoperative care.  They were given PO and IV analgesics for pain control following their surgery.  They were given 24 hours of postoperative antibiotics of  Anti-infectives    Start     Dose/Rate Route Frequency Ordered Stop   05/06/15 1400  ceFAZolin (ANCEF) IVPB 2 g/50 mL premix     2 g 100 mL/hr over 30 Minutes Intravenous Every 6 hours 05/06/15 1110 05/07/15 0203   05/06/15 0812  polymyxin B 500,000 Units, bacitracin 50,000 Units in sodium chloride irrigation 0.9 % 500 mL irrigation  Status:  Discontinued       As needed 05/06/15 0813 05/06/15 0947   05/06/15 0600  ceFAZolin (ANCEF) IVPB 2 g/50 mL premix     2 g 100 mL/hr over 30 Minutes Intravenous On call to O.R. 05/06/15 1962 05/06/15 0734     and started on DVT prophylaxis in the form of Xarelto, Neilson hose, SCDs.  PT and OT were ordered for total joint protocol.  Discharge planning consulted to help with postop disposition and equipment needs.  Patient had a  fair night on the evening of surgery.  They started to get up OOB with therapy on day one. Continued to work with therapy into day two. By day three, the patient had progressed with therapy and meeting their goals.  Incision was healing well.  Patient was seen in rounds and was ready to go home.   Diet: Diabetic diet Activity:WBAT Follow-up:in 10-14 days Disposition - Home Discharged Condition: good      Medication List    STOP taking these medications        fexofenadine 180 MG tablet    Commonly known as:  ALLEGRA     HYDROcodone-acetaminophen 5-325 MG per tablet  Commonly known as:  NORCO/VICODIN      TAKE these medications        ALPRAZolam 0.5 MG tablet  Commonly known as:  XANAX  Take 0.5 mg by mouth at bedtime as needed for anxiety (took one of wifes pills).     docusate sodium 100 MG capsule  Commonly known as:  COLACE  Take 1 capsule (100 mg total) by mouth 2 (two) times daily as needed for mild constipation.     methocarbamol 500 MG tablet  Commonly known as:  ROBAXIN  Take 1 tablet (500 mg total) by mouth 3 (three) times daily between meals as needed for muscle spasms.     oxyCODONE-acetaminophen 5-325 MG per tablet  Commonly known as:  PERCOCET  Take 1-2 tablets by mouth every 4 (four) hours as needed for severe pain.     rivaroxaban 10 MG Tabs tablet  Commonly known as:  XARELTO  Take 1 tablet (10 mg total) by mouth daily.           Follow-up Information    Follow up with BEANE,JEFFREY C, MD In 2 weeks.   Specialty:  Orthopedic Surgery   Why:  For suture removal   Contact information:   [address] [place] [address] [phone]       Follow up with [facility].   Why:  Home Health Physical Therapy   Contact information:   [place] [address] [phone]       Signed: Lacie Draft, PA-C Orthopaedic Surgery 05/10/2015, 11:12 AM

## 2015-05-11 DIAGNOSIS — Z471 Aftercare following joint replacement surgery: Secondary | ICD-10-CM | POA: Diagnosis not present

## 2015-05-11 DIAGNOSIS — Z96652 Presence of left artificial knee joint: Secondary | ICD-10-CM | POA: Diagnosis not present

## 2015-05-11 DIAGNOSIS — Z87891 Personal history of nicotine dependence: Secondary | ICD-10-CM | POA: Diagnosis not present

## 2015-05-11 DIAGNOSIS — M4802 Spinal stenosis, cervical region: Secondary | ICD-10-CM | POA: Diagnosis not present

## 2015-05-11 DIAGNOSIS — G629 Polyneuropathy, unspecified: Secondary | ICD-10-CM | POA: Diagnosis not present

## 2015-05-11 DIAGNOSIS — M1990 Unspecified osteoarthritis, unspecified site: Secondary | ICD-10-CM | POA: Diagnosis not present

## 2015-05-12 DIAGNOSIS — M1990 Unspecified osteoarthritis, unspecified site: Secondary | ICD-10-CM | POA: Diagnosis not present

## 2015-05-12 DIAGNOSIS — Z96652 Presence of left artificial knee joint: Secondary | ICD-10-CM | POA: Diagnosis not present

## 2015-05-12 DIAGNOSIS — M4802 Spinal stenosis, cervical region: Secondary | ICD-10-CM | POA: Diagnosis not present

## 2015-05-12 DIAGNOSIS — G629 Polyneuropathy, unspecified: Secondary | ICD-10-CM | POA: Diagnosis not present

## 2015-05-12 DIAGNOSIS — Z471 Aftercare following joint replacement surgery: Secondary | ICD-10-CM | POA: Diagnosis not present

## 2015-05-12 DIAGNOSIS — Z87891 Personal history of nicotine dependence: Secondary | ICD-10-CM | POA: Diagnosis not present

## 2015-05-13 DIAGNOSIS — M4802 Spinal stenosis, cervical region: Secondary | ICD-10-CM | POA: Diagnosis not present

## 2015-05-13 DIAGNOSIS — G629 Polyneuropathy, unspecified: Secondary | ICD-10-CM | POA: Diagnosis not present

## 2015-05-13 DIAGNOSIS — M1990 Unspecified osteoarthritis, unspecified site: Secondary | ICD-10-CM | POA: Diagnosis not present

## 2015-05-13 DIAGNOSIS — Z96652 Presence of left artificial knee joint: Secondary | ICD-10-CM | POA: Diagnosis not present

## 2015-05-13 DIAGNOSIS — Z87891 Personal history of nicotine dependence: Secondary | ICD-10-CM | POA: Diagnosis not present

## 2015-05-13 DIAGNOSIS — Z471 Aftercare following joint replacement surgery: Secondary | ICD-10-CM | POA: Diagnosis not present

## 2015-05-14 DIAGNOSIS — G629 Polyneuropathy, unspecified: Secondary | ICD-10-CM | POA: Diagnosis not present

## 2015-05-14 DIAGNOSIS — M4802 Spinal stenosis, cervical region: Secondary | ICD-10-CM | POA: Diagnosis not present

## 2015-05-14 DIAGNOSIS — Z87891 Personal history of nicotine dependence: Secondary | ICD-10-CM | POA: Diagnosis not present

## 2015-05-14 DIAGNOSIS — Z96652 Presence of left artificial knee joint: Secondary | ICD-10-CM | POA: Diagnosis not present

## 2015-05-14 DIAGNOSIS — M1990 Unspecified osteoarthritis, unspecified site: Secondary | ICD-10-CM | POA: Diagnosis not present

## 2015-05-14 DIAGNOSIS — Z471 Aftercare following joint replacement surgery: Secondary | ICD-10-CM | POA: Diagnosis not present

## 2015-05-17 DIAGNOSIS — M1712 Unilateral primary osteoarthritis, left knee: Secondary | ICD-10-CM | POA: Diagnosis not present

## 2015-05-19 DIAGNOSIS — M1712 Unilateral primary osteoarthritis, left knee: Secondary | ICD-10-CM | POA: Diagnosis not present

## 2015-05-20 DIAGNOSIS — Z96652 Presence of left artificial knee joint: Secondary | ICD-10-CM | POA: Diagnosis not present

## 2015-05-20 DIAGNOSIS — Z471 Aftercare following joint replacement surgery: Secondary | ICD-10-CM | POA: Diagnosis not present

## 2015-05-21 DIAGNOSIS — M1712 Unilateral primary osteoarthritis, left knee: Secondary | ICD-10-CM | POA: Diagnosis not present

## 2015-05-24 DIAGNOSIS — M1712 Unilateral primary osteoarthritis, left knee: Secondary | ICD-10-CM | POA: Diagnosis not present

## 2015-05-26 DIAGNOSIS — M1712 Unilateral primary osteoarthritis, left knee: Secondary | ICD-10-CM | POA: Diagnosis not present

## 2015-05-27 DIAGNOSIS — Z96652 Presence of left artificial knee joint: Secondary | ICD-10-CM | POA: Diagnosis not present

## 2015-05-27 DIAGNOSIS — Z471 Aftercare following joint replacement surgery: Secondary | ICD-10-CM | POA: Diagnosis not present

## 2015-05-28 DIAGNOSIS — M1712 Unilateral primary osteoarthritis, left knee: Secondary | ICD-10-CM | POA: Diagnosis not present

## 2015-06-01 DIAGNOSIS — M1712 Unilateral primary osteoarthritis, left knee: Secondary | ICD-10-CM | POA: Diagnosis not present

## 2015-06-03 DIAGNOSIS — M1712 Unilateral primary osteoarthritis, left knee: Secondary | ICD-10-CM | POA: Diagnosis not present

## 2015-06-09 DIAGNOSIS — M1712 Unilateral primary osteoarthritis, left knee: Secondary | ICD-10-CM | POA: Diagnosis not present

## 2015-06-11 DIAGNOSIS — M1712 Unilateral primary osteoarthritis, left knee: Secondary | ICD-10-CM | POA: Diagnosis not present

## 2015-06-16 DIAGNOSIS — M1712 Unilateral primary osteoarthritis, left knee: Secondary | ICD-10-CM | POA: Diagnosis not present

## 2015-06-17 DIAGNOSIS — Z471 Aftercare following joint replacement surgery: Secondary | ICD-10-CM | POA: Diagnosis not present

## 2015-06-17 DIAGNOSIS — Z96652 Presence of left artificial knee joint: Secondary | ICD-10-CM | POA: Diagnosis not present

## 2015-06-25 DIAGNOSIS — M1712 Unilateral primary osteoarthritis, left knee: Secondary | ICD-10-CM | POA: Diagnosis not present

## 2015-06-30 DIAGNOSIS — M1712 Unilateral primary osteoarthritis, left knee: Secondary | ICD-10-CM | POA: Diagnosis not present

## 2015-07-07 DIAGNOSIS — M1712 Unilateral primary osteoarthritis, left knee: Secondary | ICD-10-CM | POA: Diagnosis not present

## 2015-07-12 DIAGNOSIS — M1712 Unilateral primary osteoarthritis, left knee: Secondary | ICD-10-CM | POA: Diagnosis not present

## 2015-07-19 DIAGNOSIS — M1712 Unilateral primary osteoarthritis, left knee: Secondary | ICD-10-CM | POA: Diagnosis not present

## 2015-07-26 DIAGNOSIS — M1712 Unilateral primary osteoarthritis, left knee: Secondary | ICD-10-CM | POA: Diagnosis not present

## 2015-07-28 DIAGNOSIS — Z96652 Presence of left artificial knee joint: Secondary | ICD-10-CM | POA: Diagnosis not present

## 2015-07-28 DIAGNOSIS — Z471 Aftercare following joint replacement surgery: Secondary | ICD-10-CM | POA: Diagnosis not present

## 2015-08-05 DIAGNOSIS — M1712 Unilateral primary osteoarthritis, left knee: Secondary | ICD-10-CM | POA: Diagnosis not present

## 2016-03-30 DIAGNOSIS — D235 Other benign neoplasm of skin of trunk: Secondary | ICD-10-CM | POA: Diagnosis not present

## 2016-03-30 DIAGNOSIS — L821 Other seborrheic keratosis: Secondary | ICD-10-CM | POA: Diagnosis not present

## 2016-03-30 DIAGNOSIS — L814 Other melanin hyperpigmentation: Secondary | ICD-10-CM | POA: Diagnosis not present

## 2016-03-30 DIAGNOSIS — D1801 Hemangioma of skin and subcutaneous tissue: Secondary | ICD-10-CM | POA: Diagnosis not present

## 2016-03-30 DIAGNOSIS — L570 Actinic keratosis: Secondary | ICD-10-CM | POA: Diagnosis not present

## 2016-06-23 IMAGING — DX DG KNEE 1-2V PORT*L*
2 series · 2 of 2 positions shown · non-contrast
Comparison: April 27, 2015.

CLINICAL DATA: Status post right total knee replacement.

EXAM:
PORTABLE LEFT KNEE - 1-2 VIEW

[knee ap]
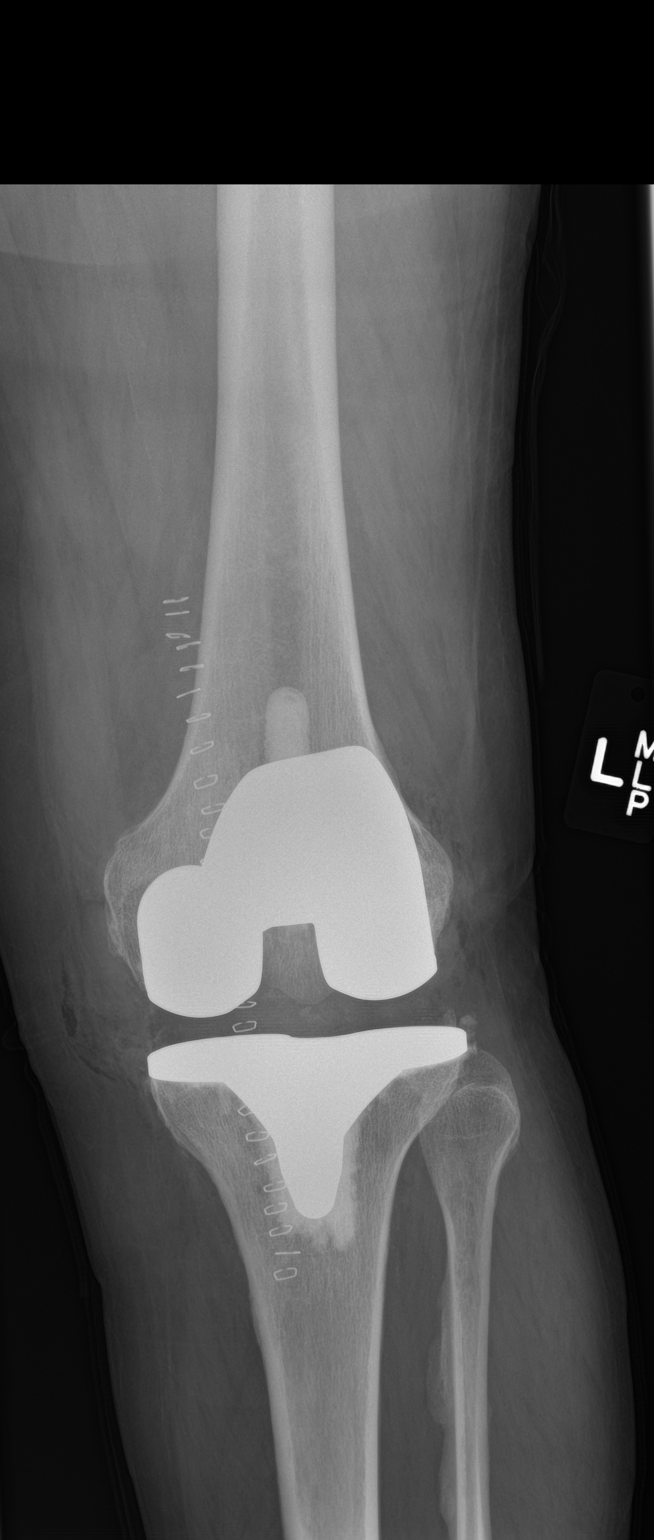

[knee lat]
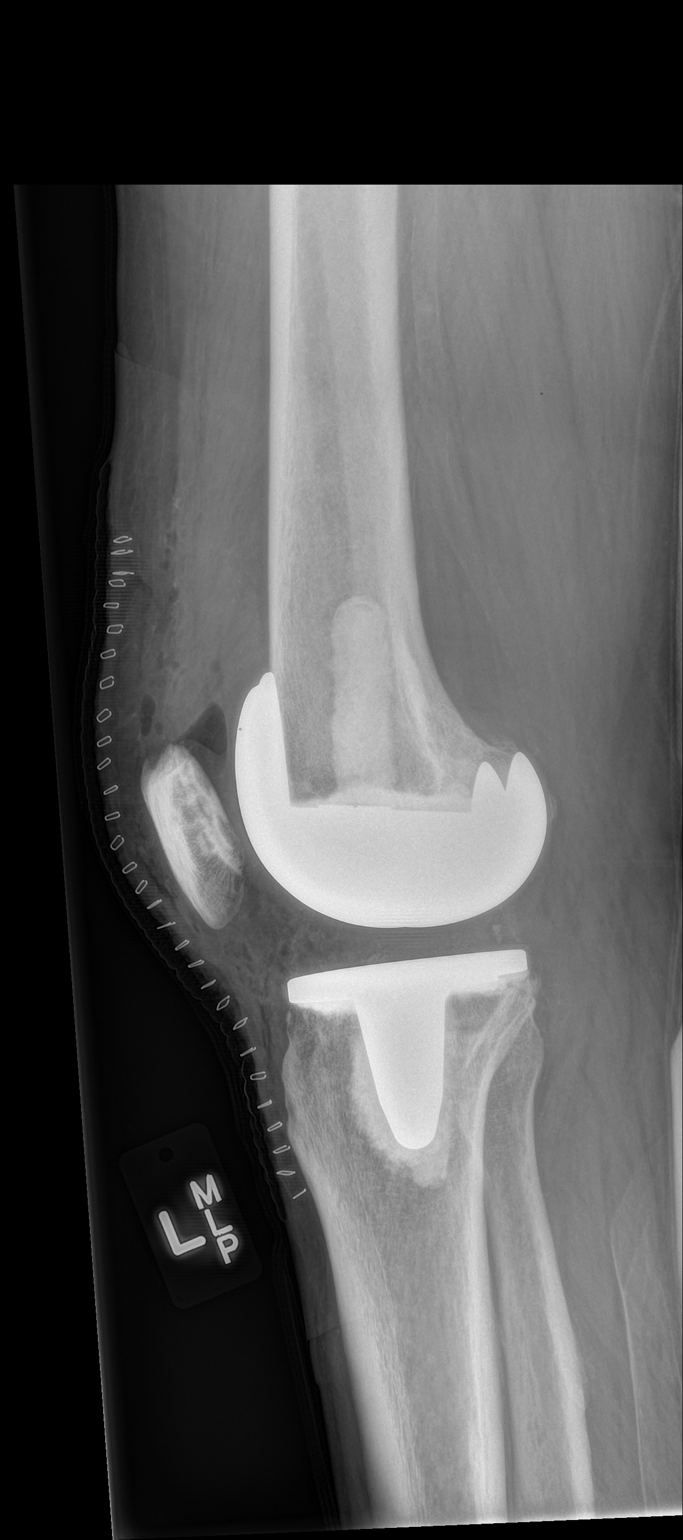

[2 of 2 positions shown; findings below may reference images not displayed]

FINDINGS: The femoral and tibial components appear to be well situated. No
acute fracture or dislocation is noted. Expected amount of
postoperative soft tissue gas is noted anteriorly. Surgical staples
are noted anteriorly.
IMPRESSION: Status post left total knee arthroplasty.

## 2016-07-04 DIAGNOSIS — Z23 Encounter for immunization: Secondary | ICD-10-CM | POA: Diagnosis not present

## 2016-08-14 ENCOUNTER — Ambulatory Visit (HOSPITAL_COMMUNITY)
Admission: EM | Admit: 2016-08-14 | Discharge: 2016-08-14 | Disposition: A | Discharge status: Home / Self Care | Payer: PPO | Attending: Family Medicine | Admitting: Family Medicine

## 2016-08-14 ENCOUNTER — Encounter (HOSPITAL_COMMUNITY): Payer: Self-pay | Admitting: Family Medicine

## 2016-08-14 DIAGNOSIS — K219 Gastro-esophageal reflux disease without esophagitis: Secondary | ICD-10-CM

## 2016-08-14 MED ORDER — OMEPRAZOLE 20 MG PO CPDR: 20.0000 mg | DELAYED_RELEASE_CAPSULE | Freq: Every day | ORAL | 1 refills | Status: DC

## 2016-08-14 MED ORDER — HYOSCYAMINE SULFATE SL 0.125 MG SL SUBL: 1.0000 | SUBLINGUAL_TABLET | Freq: Three times a day (TID) | SUBLINGUAL | 1 refills | Status: DC | PRN

## 2016-08-14 NOTE — ED Provider Notes (Signed)
[facility]    CSN: [id number] Arrival date & time: 08/14/16  1945     History   Chief Complaint Chief Complaint  Patient presents with  . Gastroesophageal Reflux    HPI Jeffrey Banks is a 65 y.o. male.   This 65 year old gentleman presents with what he thinks may be GERD. He recently went on a hunting trip to [place] and noticed that every time he ate, he developed some epigastric pressure. This happened again today and he decided after discussing his symptoms with his wife, that he should have it checked out.  When he was in [place] his blood pressure was 140/90 which is a little high for him. He had a blood pressure around 140/98 again today.  Patient's had no shortness of breath. He's had no leg pain. He has no orthostatic symptoms and the symptoms of epigastric pressure do not change when lying down. He's had no tenderness in his chest.  Patient's had no nausea or diaphoresis.      Past Medical History:  Diagnosis Date  . Arthritis   . Back pain 2009  . Cough   . Depression   . Diabetes mellitus without complication (HCC)    borderline   . Hyperlipidemia   . Impaired fasting glucose 2014  . OSA (obstructive sleep apnea) 2006   on CPAP does not know settings   . Rhinitis   . Stenosis of cervical spine     Patient Active Problem List   Diagnosis Date Noted  . Primary osteoarthritis of left knee 05/06/2015  . Left knee DJD 05/06/2015  . CHEST PAIN 02/02/2010    Past Surgical History:  Procedure Laterality Date  . bilateral cataract surgery      with lens implants   . lamenectomy  2010  . TONSILLECTOMY  1969  . TOTAL KNEE ARTHROPLASTY Left 05/06/2015   Procedure: LEFT TOTAL KNEE ARTHROPLASTY;  Surgeon: Susa Day, MD;  Location: [facility];  Service: Orthopedics;  Laterality: Left;       Home Medications    Prior to Admission medications   Medication Sig Start Date End Date Taking? Authorizing Provider  ALPRAZolam Duanne Moron) 0.5  MG tablet Take 0.5 mg by mouth at bedtime as needed for anxiety (took one of wifes pills).    Historical Provider, MD  Hyoscyamine Sulfate SL (LEVSIN/SL) 0.125 MG SUBL Place 1 tablet under the tongue 3 (three) times daily with meals as needed. 08/14/16   Robyn Haber, MD  omeprazole (PRILOSEC) 20 MG capsule Take 1 capsule (20 mg total) by mouth daily. 08/14/16   Robyn Haber, MD    Family History Family History  Problem Relation Age of Onset  . High Cholesterol Mother   . Heart failure Father     Social History Social History  Substance Use Topics  . Smoking status: Former Research scientist (life sciences)  . Smokeless tobacco: Former Systems developer  . Alcohol use No     Allergies   Review of patient's allergies indicates no known allergies.   Review of Systems Review of Systems  Constitutional: Negative.   HENT: Negative.   Eyes: Negative.   Respiratory: Negative for apnea, cough, choking, chest tightness, shortness of breath, wheezing and stridor.   Cardiovascular: Positive for chest pain. Negative for palpitations and leg swelling.  Gastrointestinal: Positive for abdominal distention. Negative for nausea and vomiting.  Genitourinary: Negative.   Musculoskeletal: Negative.   Neurological: Negative.   Psychiatric/Behavioral: Negative.      Physical Exam Triage Vital Signs  ED Triage Vitals  Enc Vitals Group     BP 08/14/16 2000 153/80     Pulse Rate 08/14/16 2000 60     Resp 08/14/16 2000 18     Temp 08/14/16 2000 98 F (36.7 C)     Temp Source 08/14/16 2000 Oral     SpO2 08/14/16 2000 98 %     Weight --      Height --      Head Circumference --      Peak Flow --      Pain Score 08/14/16 2007 4     Pain Loc --      Pain Edu? --      Excl. in [place]? --    No data found.   Updated Vital Signs BP 153/80 (BP Location: Left Arm)   Pulse 60   Temp 98 F (36.7 C) (Oral)   Resp 18   SpO2 98%    Physical Exam  Constitutional: He is oriented to person, place, and time. He appears  well-developed and well-nourished.  HENT:  Head: Normocephalic.  Right Ear: External ear normal.  Left Ear: External ear normal.  Mouth/Throat: Oropharynx is clear and moist.  Eyes: Conjunctivae and EOM are normal. Pupils are equal, round, and reactive to light.  Neck: Normal range of motion. Neck supple.  Cardiovascular: Normal rate, regular rhythm, normal heart sounds and intact distal pulses.   Pulmonary/Chest: Effort normal and breath sounds normal.  Abdominal: Soft. Bowel sounds are normal. He exhibits distension. He exhibits no mass. There is no tenderness. There is no rebound and no guarding. No hernia.  This is an obese gentleman  Musculoskeletal: Normal range of motion.  Neurological: He is alert and oriented to person, place, and time.  Skin: Skin is warm and dry.  Nursing note and vitals reviewed.    UC Treatments / Results  Labs (all labs ordered are listed, but only abnormal results are displayed) Labs Reviewed - No data to display  EKG Sinus bradycardia with no acute changes  Radiology No results found.  Procedures Procedures (including critical care time)  Medications Ordered in UC Medications - No data to display   Initial Impression / Assessment and Plan / UC Course  I have reviewed the triage vital signs and the nursing notes.  Pertinent labs & imaging results that were available during my care of the patient were reviewed by me and considered in my medical decision making (see chart for details).  Clinical Course    Final Clinical Impressions(s) / UC Diagnoses   Final diagnoses:  Gastroesophageal reflux disease without esophagitis    New Prescriptions New Prescriptions   HYOSCYAMINE SULFATE SL (LEVSIN/SL) 0.125 MG SUBL    Place 1 tablet under the tongue 3 (three) times daily with meals as needed.   OMEPRAZOLE (PRILOSEC) 20 MG CAPSULE    Take 1 capsule (20 mg total) by mouth daily.     Robyn Haber, MD 08/14/16 2033

## 2016-08-14 NOTE — Discharge Instructions (Signed)
Your symptoms appear to be related to reflux esophagitis. For that reason, he should continue the Prilosec.  If the symptoms are not controlled by Prilosec after a week, you should make a point with Dr. Philip Aspen.  I have given you a prescription for Levsin (hyoscyamine)  in case the symptoms get dramatically worse after eating.  Clearly if the symptoms dramatically worsen over the next couple days, he needs to go to the emergency room for further testing.  You are absolutely right that losing weight will help reduce the frequency of these uncomfortable sensations.

## 2016-08-14 NOTE — ED Triage Notes (Signed)
The patient presented to the [facility] with a complaint of epigastric pain that he described as a pressure and burning over his sternal area that started today around 2 pm today after eating. The patient denied any radiation of the pain, or N/V. The patient did report having some reflux related issues earlier this week.

## 2016-11-04 DIAGNOSIS — J111 Influenza due to unidentified influenza virus with other respiratory manifestations: Secondary | ICD-10-CM | POA: Diagnosis not present

## 2017-04-03 DIAGNOSIS — I10 Essential (primary) hypertension: Secondary | ICD-10-CM | POA: Diagnosis not present

## 2017-04-03 DIAGNOSIS — G4733 Obstructive sleep apnea (adult) (pediatric): Secondary | ICD-10-CM | POA: Diagnosis not present

## 2017-04-03 DIAGNOSIS — Z6837 Body mass index (BMI) 37.0-37.9, adult: Secondary | ICD-10-CM | POA: Diagnosis not present

## 2017-04-03 DIAGNOSIS — R7301 Impaired fasting glucose: Secondary | ICD-10-CM | POA: Diagnosis not present

## 2017-04-03 DIAGNOSIS — R21 Rash and other nonspecific skin eruption: Secondary | ICD-10-CM | POA: Diagnosis not present

## 2017-04-03 DIAGNOSIS — Z1389 Encounter for screening for other disorder: Secondary | ICD-10-CM | POA: Diagnosis not present

## 2017-04-03 DIAGNOSIS — M545 Low back pain: Secondary | ICD-10-CM | POA: Diagnosis not present

## 2017-05-31 ENCOUNTER — Telehealth: Payer: Self-pay

## 2017-05-31 NOTE — Telephone Encounter (Signed)
I called pt and explained to him that Dr. Rexene Alberts will out be out of the office on 06/05/2017 and his appt will need to be rescheduled. Pt is agreeable to an appt on 06/12/2017 at 8:00am. Pt verbalized understanding of new appt date and time and to bring his cpap.

## 2017-06-05 ENCOUNTER — Ambulatory Visit: Payer: 59 | Admitting: Neurology

## 2017-06-12 ENCOUNTER — Encounter: Payer: Self-pay | Admitting: Neurology

## 2017-06-12 ENCOUNTER — Encounter (INDEPENDENT_AMBULATORY_CARE_PROVIDER_SITE_OTHER): Payer: Self-pay

## 2017-06-12 ENCOUNTER — Ambulatory Visit (INDEPENDENT_AMBULATORY_CARE_PROVIDER_SITE_OTHER): Payer: PPO | Admitting: Neurology

## 2017-06-12 VITALS — BP 144/74 | HR 59 | Ht 71.0 in | Wt 261.0 lb

## 2017-06-12 DIAGNOSIS — F1190 Opioid use, unspecified, uncomplicated: Secondary | ICD-10-CM | POA: Diagnosis not present

## 2017-06-12 DIAGNOSIS — R635 Abnormal weight gain: Secondary | ICD-10-CM

## 2017-06-12 DIAGNOSIS — E669 Obesity, unspecified: Secondary | ICD-10-CM

## 2017-06-12 DIAGNOSIS — G4733 Obstructive sleep apnea (adult) (pediatric): Secondary | ICD-10-CM

## 2017-06-12 DIAGNOSIS — J301 Allergic rhinitis due to pollen: Secondary | ICD-10-CM | POA: Diagnosis not present

## 2017-06-12 NOTE — Patient Instructions (Addendum)
Based on your history, you have obstructive sleep apnea, but your sleep study was over 10 years ago. In order to update the diagnosis, optimize treatment settings and qualify for a new machine, I suggest we proceed with a sleep study to determine how severe your sleep apnea is and what pressure settings you need. Please remember, the risks and ramifications of moderate to severe obstructive sleep apnea or OSA are: Cardiovascular disease, including congestive heart failure, stroke, difficult to control hypertension, arrhythmias, and even type 2 diabetes has been linked to untreated OSA. Sleep apnea causes disruption of sleep and sleep deprivation in most cases, which, in turn, can cause recurrent headaches, problems with memory, mood, concentration, focus, and vigilance. Most people with untreated sleep apnea report excessive daytime sleepiness, which can affect their ability to drive. Please do not drive if you feel sleepy.   I will likely see you back after your sleep study to go over the test results and where to go from there. We will call you after your sleep study to advise about the results (most likely, you will hear from Beverlee Nims, my nurse) and to set up an appointment at the time, as necessary.    Our sleep lab administrative assistant, Arrie Aran will call you to schedule your sleep study. If you don't hear back from her by about 2 weeks from now, please feel free to call her at [phone]. This is her direct line and please leave a message with your phone number to call back if you get the voicemail box. She will call back as soon as possible.

## 2017-06-12 NOTE — Progress Notes (Signed)
Subjective:    Patient ID: Jeffrey Banks is a 66 y.o. male.  HPI     Interim history:   Jeffrey Banks is a 66 year old right-handed gentleman with an underlying medical history of hyperlipidemia, rhinitis, depression, low back pain, hypertension, impaired fasting glucose, and obesity, who presents for re-evaluation of his prior diagnosis of OSA. The patient is unaccompanied today. I first met him on 10/05/2014 at the request of his primary care physician, at which time he reported a prior diagnosis of obstructive sleep apnea. I reviewed his sleep study results from 2006. He was compliant with his machine which was older but was working fine per his report. I suggested he continue to be fully compliant with his AutoPap machine. I suggested as needed follow-up.   Today, 06/12/2017: He reports needing new supplies. Has had weight gain over the last few years. Wt was 248 in 2006. He had left total knee replacement in the interim on 05/06/2015, under Dr. Tonita Cong. He does take the occasional hydrocodone at night. Usually about once a week or so. He works in Press [employer]. He drinks caffeine in the form of coffee, one or 2 per day. 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 He is status post neck surgery in the past.  Previously:  10/05/2014: (He) has a diagnosis of severe obstructive sleep apnea since 2006. He has been on CPAP therapy at 9 cm of water pressure and reports compliance with the machine. However, his equipment is almost 68 years old and he has never been reevaluated since his original diagnosis. Of note, he had a split-night sleep study at the [facility] on 03/23/2005 and I reviewed the report. Sleep efficiency was 59% for the baseline portion of the sleep study. His AHI was 66 per hour. Average oxygen saturation was 94%, nadir was 87%. He was observed to  snore loudly. He was titrated on CPAP. He was at the time not tolerant of CPAP above a pressure of 4 cm. It was recommended that he start AutoPap therapy at home.   I was able to review compliance data and the patient has been compliant with treatment. His residual AHI fluctuates quite a bit however. It averages a little bit above 5 per hour. Leak at times can be high. Of note, he has a full beard. He uses nasal pillows and is satisfied with him. He gets his supplies online. His machine is almost 68 years old but works fine for him. He's indicating full compliance and good results since he started using CPAP. He has no particular sleep related complaints. His bedtime is 10 PM and he falls asleep fairly quickly. His wake time is 5 AM. He has no morning headaches or nocturia. He denies restless leg symptoms. He is not known to twitch in his sleep. His weight is about the same since his sleep study some 9 years ago. He has no family history of obstructive sleep apnea. He drinks about 3 cups of coffee per day and no sodas. He quit smoking over 30 years ago and does not drink any alcohol. He had neck surgery some 7 years ago and will require left knee replacement surgery soon.  His Past Medical History Is Significant For: Past Medical History:  Diagnosis Date  . Arthritis   . Back pain 2009  . Cough   . Depression   .  Diabetes mellitus without complication (HCC)    borderline   . Hyperlipidemia   . Impaired fasting glucose 2014  . OSA (obstructive sleep apnea) 2006   on CPAP does not know settings   . Rhinitis   . Stenosis of cervical spine     His Past Surgical History Is Significant For: Past Surgical History:  Procedure Laterality Date  . bilateral cataract surgery      with lens implants   . lamenectomy  2010  . TONSILLECTOMY  1969  . TOTAL KNEE ARTHROPLASTY Left 05/06/2015   Procedure: LEFT TOTAL KNEE ARTHROPLASTY;  Surgeon: Susa Day, MD;  Location: [facility];  Service: Orthopedics;   Laterality: Left;    His Family History Is Significant For: Family History  Problem Relation Age of Onset  . High Cholesterol Mother   . Heart failure Father     His Social History Is Significant For: Social History   Social History  . Marital status: Married    Spouse name: Jeffrey Banks  . Number of children: 2  . Years of education: college   Occupational History  .      [employer]   Social History Main Topics  . Smoking status: Former Research scientist (life sciences)  . Smokeless tobacco: Former Systems developer  . Alcohol use No  . Drug use: No  . Sexual activity: Not Asked   Other Topics Concern  . None   Social History Narrative   Consumes 2-3 cups of caffeine daily    His Allergies Are:  No Known Allergies:   His Current Medications Are:  Outpatient Encounter Prescriptions as of 06/12/2017  Medication Sig  . omeprazole (PRILOSEC) 20 MG capsule Take 1 capsule (20 mg total) by mouth daily.  . [DISCONTINUED] ALPRAZolam (XANAX) 0.5 MG tablet Take 0.5 mg by mouth at bedtime as needed for anxiety (took one of wifes pills).  . [DISCONTINUED] Hyoscyamine Sulfate SL (LEVSIN/SL) 0.125 MG SUBL Place 1 tablet under the tongue 3 (three) times daily with meals as needed.   No facility-administered encounter medications on file as of 06/12/2017.   :  Review of Systems:  Out of a complete 14 point review of systems, all are reviewed and negative with the exception of these symptoms as listed below: Review of Systems  Neurological:       Pt presents today to follow up on his cpap. Pt's current machine is over 37 years old and pt is requesting a new machine. Pt is unsure of his local DME, but thinks it may be [facility]. Pt's current cpap machine is unable to be downloaded, but the pt reports using his cpap every night, and even during daytime naps.    Objective:  Neurological Exam  Physical Exam Physical Examination:   Vitals:   06/12/17 0752  BP: (!) 144/74  Pulse: (!) 59   General  Examination: The patient is a very pleasant 66 y.o. male in no acute distress. He appears well-developed and well-nourished and well groomed.   HEENT: Normocephalic, atraumatic, pupils are equal, round and reactive to light and accommodation. He has corrective eyeglasses. Extraocular tracking is good without limitation to gaze excursion or nystagmus noted. Normal smooth pursuit is noted. Hearing is grossly intact. Face is symmetric with normal facial animation and normal facial sensation. Speech is clear with no dysarthria noted. There is no hypophonia. There is no lip, neck/head, jaw or voice tremor. Neck is supple with full range of passive and active motion. There are no carotid bruits on  auscultation. Oropharynx exam reveals: mild mouth dryness, good dental hygiene and mild to moderate airway crowding, due to smaller airway opening and redundant soft palate, wider uvula. Mallampati is class I. Tonsils are absent, neck circumference is 20-1/8 inches, was 19 and 1/8 inches in 12/15.  Chest: Clear to auscultation without wheezing, rhonchi or crackles noted.  Heart: S1+S2+0, regular and normal without murmurs, rubs or gallops noted.   Abdomen: Soft, non-tender and non-distended with normal bowel sounds appreciated on auscultation.  Extremities: There is no pitting edema in the distal lower extremities bilaterally.   Skin: Warm and dry without trophic changes noted. There are no varicose veins.  Musculoskeletal: exam reveals no obvious joint deformities, tenderness or joint swelling or erythema, with the exception of unremarkable left knee replacement scar.   Neurologically:  Mental status: The patient is awake, alert and oriented in all 4 spheres. His immediate and remote memory, attention, language skills and fund of knowledge are appropriate. There is no evidence of aphasia, agnosia, apraxia or anomia. Speech is clear with normal prosody and enunciation. Thought process is linear. Mood is  normal and affect is normal.  Cranial nerves II - XII are as described above under HEENT exam. In addition: shoulder shrug is normal with equal shoulder height noted. Motor exam: Normal bulk, strength and tone is noted. There is no drift, tremor or rebound. Reflexes are 1+ throughout, except absent in the left knee. Fine motor skills and coordination:  grossly intact.  Cerebellar testing: No dysmetria or intention tremor on finger to nose testing. There is no truncal or gait ataxia.  Sensory exam: intact to light touch in the upper and lower extremities.  Gait, station and balance: He stands easily. No veering to one side is noted. No leaning to one side is noted. Posture is age-appropriate and stance is narrow based. Gait shows normal stride length and normal pace. No problems turning are noted.   Assessment and Plan:   In summary, BAPTISTE LITTLER is a very pleasant 66 year old male with an underlying medical history of hyperlipidemia, rhinitis, depression, low back pain, hypertension, impaired fasting glucose, and obesity, who has a prior diagnosis of severe obstructive sleep apnea. His sleep study was over 10 years ago and his machine is old. He reports compliance with treatment. He is advised to proceed with a sleep study to update the diagnosis, optimize treatment settings and qualify for a new machine. His weight has increased from when he had his sleep study in 2006. I did review limited compliance dataIn the past but was not able to review more recent data. He reports compliance with treatment but certainly does need new evaluation due to weight gain and needing new equipment. He has been using a medium nasal pillows interface which he tolerates. He is not typically able to sleep on his back because of back pain and takes hydrocodone as needed, on average about once a week. He is agreeable to proceeding with a sleep study. I will see him back after the study is completed read I answered all his  questions today and he was in agreement.  I spent 30 minutes in total face-to-face time with the patient, more than 50% of which was spent in counseling and coordination of care, reviewing test results, reviewing medication and discussing or reviewing the diagnosis of OSA, its prognosis and treatment options. Pertinent laboratory and imaging test results that were available during this visit with the patient were reviewed by me and considered in my  medical decision making (see chart for details).

## 2017-07-22 ENCOUNTER — Ambulatory Visit (INDEPENDENT_AMBULATORY_CARE_PROVIDER_SITE_OTHER): Payer: PPO | Admitting: Neurology

## 2017-07-22 DIAGNOSIS — R635 Abnormal weight gain: Secondary | ICD-10-CM

## 2017-07-22 DIAGNOSIS — G4733 Obstructive sleep apnea (adult) (pediatric): Secondary | ICD-10-CM

## 2017-07-22 DIAGNOSIS — F1190 Opioid use, unspecified, uncomplicated: Secondary | ICD-10-CM

## 2017-07-22 DIAGNOSIS — E669 Obesity, unspecified: Secondary | ICD-10-CM

## 2017-07-22 DIAGNOSIS — J301 Allergic rhinitis due to pollen: Secondary | ICD-10-CM

## 2017-08-02 ENCOUNTER — Telehealth: Payer: Self-pay

## 2017-08-02 NOTE — Progress Notes (Signed)
  Patient referred by Dr. Philip Aspen, seen by me on 06/12/17, split study on 07/22/17. Please call and notify patient that the recent sleep study confirmed the diagnosis of moderate OSA. He did well with CPAP during the study with significant improvement of the respiratory events. I will prescribe a new CPAP machine to replace his old one. He may already have a DME provider, I do not recall.  The patient will need a follow up appointment with me or CM or MM in 10 weeks post set up that has to be scheduled; please go ahead and schedule while you have the patient on the phone and make sure patient understands the importance of keeping this window for the FU appointment, as it is often an insurance requirement and failing to adhere to this may result in losing coverage for sleep apnea treatment.  Please re-enforce the importance of compliance with treatment and the need for [place] to monitor compliance data - again an insurance requirement and good feedback for the patient as far as how they are doing.  Also remind patient, that any upcoming CPAP machine or mask issues, should be first addressed with the DME company. Please ask if patient has a preference regarding DME company.  Please arrange for CPAP set up at home through a DME company of patient's choice - once you have spoken to the patient - and faxed/routed report to PCP and referring MD (if other than PCP), you can close this encounter, thanks,   Star Age, MD, PhD [facility] ([place])

## 2017-08-02 NOTE — Procedures (Signed)
PATIENT'S NAME:  Jeffrey Banks, Jeffrey Banks DOB:      1951-06-24      MR#:    [id number]     DATE OF RECORDING: 07/22/2017 REFERRING M.D.:  Leanna Battles MD Study Performed:  Split-Night Titration Study HISTORY: 66 year old man with a history of hyperlipidemia, rhinitis, depression, low back pain, hypertension, impaired fasting glucose, and obesity, who presents for re-evaluation of his prior diagnosis of OSA. The patient's weight 261 pounds with a height of 72 (inches), resulting in a BMI of 35.2 kg/m2. The patient's neck circumference measured 19.2 inches.  CURRENT MEDICATIONS: Prilosec, Hydrocodone  PROCEDURE:  This is a multichannel digital polysomnogram utilizing the Somnostar 11.2 system.  Electrodes and sensors were applied and monitored per AASM Specifications.   EEG, EOG, Chin and Limb EMG, were sampled at 200 Hz.  ECG, Snore and Nasal Pressure, Thermal Airflow, Respiratory Effort, CPAP Flow and Pressure, Oximetry was sampled at 50 Hz. Digital video and audio were recorded.      BASELINE STUDY WITHOUT CPAP RESULTS:  Lights Out was at 22:39 and Lights On at 04:42 for the night, split start at 01:21, epoch 330. Total recording time (TRT) was 161.5, with a total sleep time (TST) of 122.5 minutes. The patient's sleep latency was 32 minutes, which is delayed. REM sleep was absent. The sleep efficiency was 75.9%, which is reduced.    SLEEP ARCHITECTURE: WASO (Wake after sleep onset) was 19 minutes, Stage N1 was 61.5 minutes, Stage N2 was 61 minutes, Stage N3 was 0 minutes and Stage R (REM sleep) was 0 minutes.  The percentages were Stage N1 50.2%, which is markedly increased, Stage N2 49.8%, Stage N3 and Stage R (REM sleep) were absent. The arousals were noted as: 3 were spontaneous, 0 were associated with PLMs, 27 were associated with respiratory events.   Audio and video analysis did not show any abnormal or unusual movements, behaviors, phonations or vocalizations. The patient took no bathroom breaks.  Moderate snoring was noted. The EKG was in keeping with normal sinus rhythm (NSR).   RESPIRATORY ANALYSIS:  There were a total of 51 respiratory events:  0 obstructive apneas, 0 central apneas and 0 mixed apneas with a total of 0 apneas and an apnea index (AI) of 0. There were 51 hypopneas with a hypopnea index of 25.. The patient also had 0 respiratory event related arousals (RERAs).  Snoring was noted.     The total APNEA/HYPOPNEA INDEX (AHI) was 25/hour and the total RESPIRATORY DISTURBANCE INDEX was 25. /hour.  0 events occurred in REM sleep and 102 events in NREM. The REM AHI was n/a versus a non-REM AHI of 25. /hour. The patient spent 142.5 minutes sleep time in the supine position 169 minutes in non-supine. The supine AHI was 19.3 /hour versus a non-supine AHI of 28.1 /hour.  OXYGEN SATURATION & C02:  The wake baseline 02 saturation was 96%, with the lowest being 90%. Time spent below 89% saturation equaled 0 minutes.  PERIODIC LIMB MOVEMENTS: The patient had a total of 0 Periodic Limb Movements.  The Periodic Limb Movement (PLM) index was 0 /hour and the PLM Arousal index was 0 /hour.  TITRATION STUDY WITH CPAP RESULTS:   The patient was fitted with medium nasal pillows. CPAP was initiated at 6 cmH20 with heated humidity per AASM split night standards and pressure was advanced to 12 cmH20 because of hypopneas, apneas and desaturations.  At a PAP pressure of 12 cmH20, there was a reduction of the AHI to 0/hour  with supine REM sleep achieved and O2 nadir of 95%.   Total recording time (TRT) was 202 minutes, with a total sleep time (TST) of 188.5 minutes. The patient's sleep latency was 17 minutes. REM latency was 15.5 minutes.  The sleep efficiency was 93.3 %.    SLEEP ARCHITECTURE: Wake after sleep was 2.5 minutes, Stage N1 8.5 minutes, Stage N2 74.5 minutes, Stage N3 28 minutes and Stage R (REM sleep) 77.5 minutes. The percentages were: Stage N1 4.5%, Stage N2 39.5%, Stage N3 14.9% and Stage  R (REM sleep) 41.1%.   The arousals were noted as: 5 were spontaneous, 0 were associated with PLMs, 6 were associated with respiratory events.  RESPIRATORY ANALYSIS:  There were a total of 19 respiratory events: 0 obstructive apneas, 0 central apneas and 0 mixed apneas with a total of 0 apneas and an apnea index (AI) of 0. There were 19 hypopneas with a hypopnea index of 6. /hour. The patient also had 0 respiratory event related arousals (RERAs).      The total APNEA/HYPOPNEA INDEX  (AHI) was 6. /hour and the total RESPIRATORY DISTURBANCE INDEX was 6. /hour.  13 events occurred in REM sleep and 6 events in NREM. The REM AHI was 10.1 /hour versus a non-REM AHI of 3.2 /hour. REM sleep was achieved on a pressure of  cm/h2o (AHI was  .) The patient spent 53% of total sleep time in the supine position. The supine AHI was 4.8 /hour, versus a non-supine AHI of 7.4/hour.  OXYGEN SATURATION & C02:  The wake baseline 02 saturation was 96%, with the lowest being 89%. Time spent below 89% saturation equaled 0 minutes.  PERIODIC LIMB MOVEMENTS: The patient had a total of 0 Periodic Limb Movements. The Periodic Limb Movement (PLM) index was 0 /hour and the PLM Arousal index was 0 /hour.  Post-study, the patient indicated that sleep was worse than usual.  POLYSOMNOGRAPHY IMPRESSION:   1. Obstructive Sleep Apnea (OSA)   RECOMMENDATIONS:  1. This study confirms at least moderate obstructive sleep apnea, and the patient responded well on CPAP therapy. Of note, the absence of REM sleep during the baseline portion of the study likely underestimates his AHI and O2 nadir. I will prescribe a new home CPAP machine with a pressure set at 12 cm via medium nasal pillows with heated humidity. The patient should be reminded to be fully compliant with PAP therapy to improve sleep related symptoms and decrease long term cardiovascular risks. Please note that untreated obstructive sleep apnea carries additional perioperative  morbidity. Patients with significant obstructive sleep apnea should receive perioperative PAP therapy and the surgeons and particularly the anesthesiologist should be informed of the diagnosis and the severity of the sleep disordered breathing. 2. The patient should be cautioned not to drive, work at heights, or operate dangerous or heavy equipment when tired or sleepy. Review and reiteration of good sleep hygiene measures should be pursued with any patient.  3. The patient will be seen in follow-up by Dr. Rexene Alberts at [facility] for discussion of the test results and further management strategies. The referring provider will be notified of the test results.   I certify that I have reviewed the entire raw data recording prior to the issuance of this report in accordance with the Standards of Accreditation of the American Academy of Sleep Medicine (AASM)   Star Age, MD, PhD Diplomat, American Board of Psychiatry and Neurology (Neurology and Sleep Medicine)

## 2017-08-02 NOTE — Telephone Encounter (Signed)
-----   Message from Star Age, MD sent at 08/02/2017  7:37 AM EDT -----  Patient referred by Dr. Philip Aspen, seen by me on 06/12/17, split study on 07/22/17. Please call and notify patient that the recent sleep study confirmed the diagnosis of moderate OSA. He did well with CPAP during the study with significant improvement of the respiratory events. I will prescribe a new CPAP machine to replace his old one. He may already have a DME provider, I do not recall.  The patient will need a follow up appointment with me or CM or MM in 10 weeks post set up that has to be scheduled; please go ahead and schedule while you have the patient on the phone and make sure patient understands the importance of keeping this window for the FU appointment, as it is often an insurance requirement and failing to adhere to this may result in losing coverage for sleep apnea treatment.  Please re-enforce the importance of compliance with treatment and the need for [place] to monitor compliance data - again an insurance requirement and good feedback for the patient as far as how they are doing.  Also remind patient, that any upcoming CPAP machine or mask issues, should be first addressed with the DME company. Please ask if patient has a preference regarding DME company.  Please arrange for CPAP set up at home through a DME company of patient's choice - once you have spoken to the patient - and faxed/routed report to PCP and referring MD (if other than PCP), you can close this encounter, thanks,   Star Age, MD, PhD [facility] ([place])

## 2017-08-02 NOTE — Addendum Note (Signed)
Addended by: Star Age on: 08/02/2017 07:37 AM   Modules accepted: Orders

## 2017-08-02 NOTE — Telephone Encounter (Signed)
I called pt. I advised pt that Dr. Rexene Alberts reviewed their sleep study results and found that pt has moderate osa but did well with cpap during his sleep study. Dr. Rexene Alberts recommends that pt start a new cpap at home. Pt does not have a local DME. I reviewed PAP compliance expectations with the pt. Pt is agreeable to starting a CPAP. I advised pt that an order will be sent to a DME, [facility], and [facility] will call the pt within about one week after they file with the pt's insurance. [facility] will show the pt how to use the machine, fit for masks, and troubleshoot the CPAP if needed. A follow up appt was made for insurance purposes with Dr. Rexene Alberts on 10/18/2017 at 8:30am. Pt verbalized understanding to arrive 15 minutes early and bring their CPAP. A letter with all of this information in it will be mailed to the pt as a reminder. I verified with the pt that the address we have on file is correct. Pt verbalized understanding of results. Pt had no questions at this time but was encouraged to call back if questions arise.

## 2017-08-13 DIAGNOSIS — H00022 Hordeolum internum right lower eyelid: Secondary | ICD-10-CM | POA: Diagnosis not present

## 2017-08-22 DIAGNOSIS — G4733 Obstructive sleep apnea (adult) (pediatric): Secondary | ICD-10-CM | POA: Diagnosis not present

## 2017-09-10 ENCOUNTER — Encounter: Payer: Self-pay | Admitting: Neurology

## 2017-09-11 ENCOUNTER — Telehealth: Payer: Self-pay | Admitting: Neurology

## 2017-09-11 DIAGNOSIS — G4733 Obstructive sleep apnea (adult) (pediatric): Secondary | ICD-10-CM

## 2017-09-11 DIAGNOSIS — Z9989 Dependence on other enabling machines and devices: Secondary | ICD-10-CM

## 2017-09-11 NOTE — Addendum Note (Signed)
Addended by: Star Age on: 09/11/2017 04:48 PM   Modules accepted: Orders

## 2017-09-11 NOTE — Telephone Encounter (Signed)
I reviewed his CPAP compliance data from 08/22/2017 through 09/10/2017 which is 20 days, during which time he was 100% compliant with CPAP, pressure at 12 cm, residual AHI borderline at 5.3 per hour, leak on the high side at 25.7 L/m for the 95th percentile. He used to be on AutoPap which does feel different typically starts at a lower pressure. We can increase his ramp time and also increase the EPR. I will send an order to his DME company and we can try this approach. Please let him know.

## 2017-09-11 NOTE — Telephone Encounter (Signed)
I called pt. He reports that while he used his old cpap machine, he had no trouble sleeping. However, with the new cpap, he is waking up frequently because the pressure feels too high, and he has a very dry mouth, enough to make him get up and get some water. He has increased the humidifier and the temperature on the heated tubing but this has only helped a little. He will go back to using his old machine if he cannot adjust to this new cpap machine. I pulled therapy report for pt's cpap and will give to Dr. Rexene Alberts for review.

## 2017-09-11 NOTE — Telephone Encounter (Signed)
Patient is calling to discuss pressure for his CPAP. He thinks it is not set right because he stays awake more than he sleeps.

## 2017-09-12 NOTE — Telephone Encounter (Signed)
I called pt. I advised him that Dr. Rexene Alberts recommends increasing his ramp time and EPR. It is possible that pt is used to his auto pap, which could feel differently than his new cpap. Pt is agreeable to this. I have sent the order to [facility]. Pt will call me in a week if he is still having problems. Pt verbalized understanding of Dr. Guadelupe Sabin recommendations. I have sent the order to [facility].

## 2017-09-21 DIAGNOSIS — G4733 Obstructive sleep apnea (adult) (pediatric): Secondary | ICD-10-CM | POA: Diagnosis not present

## 2017-10-18 ENCOUNTER — Ambulatory Visit: Payer: Self-pay | Admitting: Neurology

## 2017-10-22 DIAGNOSIS — G4733 Obstructive sleep apnea (adult) (pediatric): Secondary | ICD-10-CM | POA: Diagnosis not present

## 2017-11-14 DIAGNOSIS — R05 Cough: Secondary | ICD-10-CM | POA: Diagnosis not present

## 2017-11-14 DIAGNOSIS — I10 Essential (primary) hypertension: Secondary | ICD-10-CM | POA: Diagnosis not present

## 2017-11-14 DIAGNOSIS — J069 Acute upper respiratory infection, unspecified: Secondary | ICD-10-CM | POA: Diagnosis not present

## 2017-11-14 DIAGNOSIS — J111 Influenza due to unidentified influenza virus with other respiratory manifestations: Secondary | ICD-10-CM | POA: Diagnosis not present

## 2017-11-14 DIAGNOSIS — Z6837 Body mass index (BMI) 37.0-37.9, adult: Secondary | ICD-10-CM | POA: Diagnosis not present

## 2017-12-20 DIAGNOSIS — E7849 Other hyperlipidemia: Secondary | ICD-10-CM | POA: Diagnosis not present

## 2017-12-20 DIAGNOSIS — I10 Essential (primary) hypertension: Secondary | ICD-10-CM | POA: Diagnosis not present

## 2017-12-20 DIAGNOSIS — R7301 Impaired fasting glucose: Secondary | ICD-10-CM | POA: Diagnosis not present

## 2017-12-20 DIAGNOSIS — R82998 Other abnormal findings in urine: Secondary | ICD-10-CM | POA: Diagnosis not present

## 2017-12-20 DIAGNOSIS — Z125 Encounter for screening for malignant neoplasm of prostate: Secondary | ICD-10-CM | POA: Diagnosis not present

## 2017-12-31 DIAGNOSIS — Z6837 Body mass index (BMI) 37.0-37.9, adult: Secondary | ICD-10-CM | POA: Diagnosis not present

## 2017-12-31 DIAGNOSIS — I10 Essential (primary) hypertension: Secondary | ICD-10-CM | POA: Diagnosis not present

## 2017-12-31 DIAGNOSIS — R05 Cough: Secondary | ICD-10-CM | POA: Diagnosis not present

## 2017-12-31 DIAGNOSIS — E7849 Other hyperlipidemia: Secondary | ICD-10-CM | POA: Diagnosis not present

## 2017-12-31 DIAGNOSIS — M545 Low back pain: Secondary | ICD-10-CM | POA: Diagnosis not present

## 2017-12-31 DIAGNOSIS — Z1389 Encounter for screening for other disorder: Secondary | ICD-10-CM | POA: Diagnosis not present

## 2017-12-31 DIAGNOSIS — Z0000 Encounter for general adult medical examination without abnormal findings: Secondary | ICD-10-CM | POA: Diagnosis not present

## 2017-12-31 DIAGNOSIS — R7301 Impaired fasting glucose: Secondary | ICD-10-CM | POA: Diagnosis not present

## 2017-12-31 DIAGNOSIS — E668 Other obesity: Secondary | ICD-10-CM | POA: Diagnosis not present

## 2017-12-31 DIAGNOSIS — G4733 Obstructive sleep apnea (adult) (pediatric): Secondary | ICD-10-CM | POA: Diagnosis not present

## 2018-01-03 DIAGNOSIS — Z1212 Encounter for screening for malignant neoplasm of rectum: Secondary | ICD-10-CM | POA: Diagnosis not present

## 2018-01-20 DIAGNOSIS — G4733 Obstructive sleep apnea (adult) (pediatric): Secondary | ICD-10-CM | POA: Diagnosis not present

## 2018-02-19 DIAGNOSIS — G4733 Obstructive sleep apnea (adult) (pediatric): Secondary | ICD-10-CM | POA: Diagnosis not present

## 2018-03-22 DIAGNOSIS — G4733 Obstructive sleep apnea (adult) (pediatric): Secondary | ICD-10-CM | POA: Diagnosis not present

## 2018-04-21 DIAGNOSIS — G4733 Obstructive sleep apnea (adult) (pediatric): Secondary | ICD-10-CM | POA: Diagnosis not present

## 2018-05-22 DIAGNOSIS — G4733 Obstructive sleep apnea (adult) (pediatric): Secondary | ICD-10-CM | POA: Diagnosis not present

## 2018-06-10 DIAGNOSIS — Z6838 Body mass index (BMI) 38.0-38.9, adult: Secondary | ICD-10-CM | POA: Diagnosis not present

## 2018-06-10 DIAGNOSIS — I10 Essential (primary) hypertension: Secondary | ICD-10-CM | POA: Diagnosis not present

## 2018-06-10 DIAGNOSIS — Z23 Encounter for immunization: Secondary | ICD-10-CM | POA: Diagnosis not present

## 2018-06-10 DIAGNOSIS — G4733 Obstructive sleep apnea (adult) (pediatric): Secondary | ICD-10-CM | POA: Diagnosis not present

## 2018-06-10 DIAGNOSIS — M545 Low back pain: Secondary | ICD-10-CM | POA: Diagnosis not present

## 2018-06-10 DIAGNOSIS — R7301 Impaired fasting glucose: Secondary | ICD-10-CM | POA: Diagnosis not present

## 2018-06-22 DIAGNOSIS — G4733 Obstructive sleep apnea (adult) (pediatric): Secondary | ICD-10-CM | POA: Diagnosis not present

## 2018-07-22 DIAGNOSIS — G4733 Obstructive sleep apnea (adult) (pediatric): Secondary | ICD-10-CM | POA: Diagnosis not present

## 2018-08-19 DIAGNOSIS — H00022 Hordeolum internum right lower eyelid: Secondary | ICD-10-CM | POA: Diagnosis not present

## 2018-08-22 DIAGNOSIS — G4733 Obstructive sleep apnea (adult) (pediatric): Secondary | ICD-10-CM | POA: Diagnosis not present

## 2018-12-10 DIAGNOSIS — R69 Illness, unspecified: Secondary | ICD-10-CM | POA: Diagnosis not present

## 2019-02-14 DIAGNOSIS — I10 Essential (primary) hypertension: Secondary | ICD-10-CM | POA: Diagnosis not present

## 2019-02-14 DIAGNOSIS — E7849 Other hyperlipidemia: Secondary | ICD-10-CM | POA: Diagnosis not present

## 2019-02-14 DIAGNOSIS — Z125 Encounter for screening for malignant neoplasm of prostate: Secondary | ICD-10-CM | POA: Diagnosis not present

## 2019-02-17 DIAGNOSIS — R82998 Other abnormal findings in urine: Secondary | ICD-10-CM | POA: Diagnosis not present

## 2019-02-18 DIAGNOSIS — Z0000 Encounter for general adult medical examination without abnormal findings: Secondary | ICD-10-CM | POA: Diagnosis not present

## 2019-02-18 DIAGNOSIS — R7301 Impaired fasting glucose: Secondary | ICD-10-CM | POA: Diagnosis not present

## 2019-02-18 DIAGNOSIS — E785 Hyperlipidemia, unspecified: Secondary | ICD-10-CM | POA: Diagnosis not present

## 2019-02-18 DIAGNOSIS — I10 Essential (primary) hypertension: Secondary | ICD-10-CM | POA: Diagnosis not present

## 2019-02-18 DIAGNOSIS — M545 Low back pain: Secondary | ICD-10-CM | POA: Diagnosis not present

## 2019-02-18 DIAGNOSIS — G4733 Obstructive sleep apnea (adult) (pediatric): Secondary | ICD-10-CM | POA: Diagnosis not present

## 2019-02-18 DIAGNOSIS — M542 Cervicalgia: Secondary | ICD-10-CM | POA: Diagnosis not present

## 2019-02-18 DIAGNOSIS — E669 Obesity, unspecified: Secondary | ICD-10-CM | POA: Diagnosis not present

## 2019-02-18 DIAGNOSIS — Z1331 Encounter for screening for depression: Secondary | ICD-10-CM | POA: Diagnosis not present

## 2019-02-18 DIAGNOSIS — Z1339 Encounter for screening examination for other mental health and behavioral disorders: Secondary | ICD-10-CM | POA: Diagnosis not present

## 2019-03-08 DIAGNOSIS — E669 Obesity, unspecified: Secondary | ICD-10-CM | POA: Diagnosis not present

## 2019-03-08 DIAGNOSIS — Z87891 Personal history of nicotine dependence: Secondary | ICD-10-CM | POA: Diagnosis not present

## 2019-03-08 DIAGNOSIS — J309 Allergic rhinitis, unspecified: Secondary | ICD-10-CM | POA: Diagnosis not present

## 2019-03-08 DIAGNOSIS — Z7984 Long term (current) use of oral hypoglycemic drugs: Secondary | ICD-10-CM | POA: Diagnosis not present

## 2019-03-08 DIAGNOSIS — G8929 Other chronic pain: Secondary | ICD-10-CM | POA: Diagnosis not present

## 2019-03-08 DIAGNOSIS — N529 Male erectile dysfunction, unspecified: Secondary | ICD-10-CM | POA: Diagnosis not present

## 2019-03-08 DIAGNOSIS — M1990 Unspecified osteoarthritis, unspecified site: Secondary | ICD-10-CM | POA: Diagnosis not present

## 2019-03-08 DIAGNOSIS — Z823 Family history of stroke: Secondary | ICD-10-CM | POA: Diagnosis not present

## 2019-03-08 DIAGNOSIS — R7303 Prediabetes: Secondary | ICD-10-CM | POA: Diagnosis not present

## 2019-05-18 DIAGNOSIS — H60392 Other infective otitis externa, left ear: Secondary | ICD-10-CM | POA: Diagnosis not present

## 2019-05-18 DIAGNOSIS — H66002 Acute suppurative otitis media without spontaneous rupture of ear drum, left ear: Secondary | ICD-10-CM | POA: Diagnosis not present

## 2019-08-22 DIAGNOSIS — H524 Presbyopia: Secondary | ICD-10-CM | POA: Diagnosis not present

## 2020-02-13 DIAGNOSIS — L821 Other seborrheic keratosis: Secondary | ICD-10-CM | POA: Diagnosis not present

## 2020-02-13 DIAGNOSIS — L309 Dermatitis, unspecified: Secondary | ICD-10-CM | POA: Diagnosis not present

## 2020-02-13 DIAGNOSIS — D225 Melanocytic nevi of trunk: Secondary | ICD-10-CM | POA: Diagnosis not present

## 2020-02-13 DIAGNOSIS — L570 Actinic keratosis: Secondary | ICD-10-CM | POA: Diagnosis not present

## 2020-03-01 DIAGNOSIS — R69 Illness, unspecified: Secondary | ICD-10-CM | POA: Diagnosis not present

## 2020-03-23 DIAGNOSIS — Z803 Family history of malignant neoplasm of breast: Secondary | ICD-10-CM | POA: Diagnosis not present

## 2020-03-23 DIAGNOSIS — Z6835 Body mass index (BMI) 35.0-35.9, adult: Secondary | ICD-10-CM | POA: Diagnosis not present

## 2020-03-23 DIAGNOSIS — E669 Obesity, unspecified: Secondary | ICD-10-CM | POA: Diagnosis not present

## 2020-03-23 DIAGNOSIS — R030 Elevated blood-pressure reading, without diagnosis of hypertension: Secondary | ICD-10-CM | POA: Diagnosis not present

## 2020-03-23 DIAGNOSIS — N529 Male erectile dysfunction, unspecified: Secondary | ICD-10-CM | POA: Diagnosis not present

## 2020-03-23 DIAGNOSIS — G8929 Other chronic pain: Secondary | ICD-10-CM | POA: Diagnosis not present

## 2020-03-23 DIAGNOSIS — Z87891 Personal history of nicotine dependence: Secondary | ICD-10-CM | POA: Diagnosis not present

## 2020-03-23 DIAGNOSIS — Z8249 Family history of ischemic heart disease and other diseases of the circulatory system: Secondary | ICD-10-CM | POA: Diagnosis not present

## 2020-04-08 DIAGNOSIS — R05 Cough: Secondary | ICD-10-CM | POA: Diagnosis not present

## 2020-04-08 DIAGNOSIS — J0100 Acute maxillary sinusitis, unspecified: Secondary | ICD-10-CM | POA: Diagnosis not present

## 2020-04-08 DIAGNOSIS — J310 Chronic rhinitis: Secondary | ICD-10-CM | POA: Diagnosis not present

## 2020-04-08 DIAGNOSIS — J209 Acute bronchitis, unspecified: Secondary | ICD-10-CM | POA: Diagnosis not present

## 2020-06-03 DIAGNOSIS — Z125 Encounter for screening for malignant neoplasm of prostate: Secondary | ICD-10-CM | POA: Diagnosis not present

## 2020-06-03 DIAGNOSIS — R7301 Impaired fasting glucose: Secondary | ICD-10-CM | POA: Diagnosis not present

## 2020-06-03 DIAGNOSIS — E785 Hyperlipidemia, unspecified: Secondary | ICD-10-CM | POA: Diagnosis not present

## 2020-06-10 DIAGNOSIS — R972 Elevated prostate specific antigen [PSA]: Secondary | ICD-10-CM | POA: Diagnosis not present

## 2020-06-10 DIAGNOSIS — Z1212 Encounter for screening for malignant neoplasm of rectum: Secondary | ICD-10-CM | POA: Diagnosis not present

## 2020-06-10 DIAGNOSIS — Z0000 Encounter for general adult medical examination without abnormal findings: Secondary | ICD-10-CM | POA: Diagnosis not present

## 2020-06-10 DIAGNOSIS — I10 Essential (primary) hypertension: Secondary | ICD-10-CM | POA: Diagnosis not present

## 2020-06-10 DIAGNOSIS — R82998 Other abnormal findings in urine: Secondary | ICD-10-CM | POA: Diagnosis not present

## 2020-06-10 DIAGNOSIS — R7301 Impaired fasting glucose: Secondary | ICD-10-CM | POA: Diagnosis not present

## 2020-06-10 DIAGNOSIS — E785 Hyperlipidemia, unspecified: Secondary | ICD-10-CM | POA: Diagnosis not present

## 2020-06-10 DIAGNOSIS — M545 Low back pain: Secondary | ICD-10-CM | POA: Diagnosis not present

## 2020-06-10 DIAGNOSIS — E1169 Type 2 diabetes mellitus with other specified complication: Secondary | ICD-10-CM | POA: Diagnosis not present

## 2020-06-10 DIAGNOSIS — G4733 Obstructive sleep apnea (adult) (pediatric): Secondary | ICD-10-CM | POA: Diagnosis not present

## 2020-06-10 DIAGNOSIS — E669 Obesity, unspecified: Secondary | ICD-10-CM | POA: Diagnosis not present

## 2020-07-06 DIAGNOSIS — R972 Elevated prostate specific antigen [PSA]: Secondary | ICD-10-CM | POA: Diagnosis not present

## 2020-07-28 DIAGNOSIS — R69 Illness, unspecified: Secondary | ICD-10-CM | POA: Diagnosis not present

## 2020-08-12 DIAGNOSIS — H524 Presbyopia: Secondary | ICD-10-CM | POA: Diagnosis not present

## 2020-08-18 DIAGNOSIS — Z125 Encounter for screening for malignant neoplasm of prostate: Secondary | ICD-10-CM | POA: Diagnosis not present

## 2020-08-18 DIAGNOSIS — N401 Enlarged prostate with lower urinary tract symptoms: Secondary | ICD-10-CM | POA: Diagnosis not present

## 2020-08-18 DIAGNOSIS — R3912 Poor urinary stream: Secondary | ICD-10-CM | POA: Diagnosis not present

## 2020-08-18 DIAGNOSIS — R350 Frequency of micturition: Secondary | ICD-10-CM | POA: Diagnosis not present

## 2020-09-23 DIAGNOSIS — S0502XA Injury of conjunctiva and corneal abrasion without foreign body, left eye, initial encounter: Secondary | ICD-10-CM | POA: Diagnosis not present

## 2020-10-26 DIAGNOSIS — G4733 Obstructive sleep apnea (adult) (pediatric): Secondary | ICD-10-CM | POA: Diagnosis not present

## 2020-10-26 DIAGNOSIS — G8929 Other chronic pain: Secondary | ICD-10-CM | POA: Diagnosis not present

## 2020-10-26 DIAGNOSIS — E669 Obesity, unspecified: Secondary | ICD-10-CM | POA: Diagnosis not present

## 2020-10-26 DIAGNOSIS — E785 Hyperlipidemia, unspecified: Secondary | ICD-10-CM | POA: Diagnosis not present

## 2020-10-26 DIAGNOSIS — E1169 Type 2 diabetes mellitus with other specified complication: Secondary | ICD-10-CM | POA: Diagnosis not present

## 2020-10-26 DIAGNOSIS — I10 Essential (primary) hypertension: Secondary | ICD-10-CM | POA: Diagnosis not present

## 2020-10-26 DIAGNOSIS — M545 Low back pain, unspecified: Secondary | ICD-10-CM | POA: Diagnosis not present

## 2021-02-10 DIAGNOSIS — L570 Actinic keratosis: Secondary | ICD-10-CM | POA: Diagnosis not present

## 2021-02-10 DIAGNOSIS — L821 Other seborrheic keratosis: Secondary | ICD-10-CM | POA: Diagnosis not present

## 2021-02-10 DIAGNOSIS — D225 Melanocytic nevi of trunk: Secondary | ICD-10-CM | POA: Diagnosis not present

## 2021-02-10 DIAGNOSIS — L723 Sebaceous cyst: Secondary | ICD-10-CM | POA: Diagnosis not present

## 2021-02-10 DIAGNOSIS — L578 Other skin changes due to chronic exposure to nonionizing radiation: Secondary | ICD-10-CM | POA: Diagnosis not present

## 2021-06-06 DIAGNOSIS — Z6838 Body mass index (BMI) 38.0-38.9, adult: Secondary | ICD-10-CM | POA: Diagnosis not present

## 2021-06-06 DIAGNOSIS — I10 Essential (primary) hypertension: Secondary | ICD-10-CM | POA: Diagnosis not present

## 2021-06-06 DIAGNOSIS — E1169 Type 2 diabetes mellitus with other specified complication: Secondary | ICD-10-CM | POA: Diagnosis not present

## 2021-06-06 DIAGNOSIS — E669 Obesity, unspecified: Secondary | ICD-10-CM | POA: Diagnosis not present

## 2021-06-06 DIAGNOSIS — E785 Hyperlipidemia, unspecified: Secondary | ICD-10-CM | POA: Diagnosis not present

## 2021-06-06 DIAGNOSIS — G8929 Other chronic pain: Secondary | ICD-10-CM | POA: Diagnosis not present

## 2021-06-06 DIAGNOSIS — G4733 Obstructive sleep apnea (adult) (pediatric): Secondary | ICD-10-CM | POA: Diagnosis not present

## 2021-06-06 DIAGNOSIS — M545 Low back pain, unspecified: Secondary | ICD-10-CM | POA: Diagnosis not present

## 2021-06-06 DIAGNOSIS — R972 Elevated prostate specific antigen [PSA]: Secondary | ICD-10-CM | POA: Diagnosis not present

## 2021-07-20 DIAGNOSIS — E785 Hyperlipidemia, unspecified: Secondary | ICD-10-CM | POA: Diagnosis not present

## 2021-07-20 DIAGNOSIS — Z125 Encounter for screening for malignant neoplasm of prostate: Secondary | ICD-10-CM | POA: Diagnosis not present

## 2021-07-20 DIAGNOSIS — E1169 Type 2 diabetes mellitus with other specified complication: Secondary | ICD-10-CM | POA: Diagnosis not present

## 2021-07-20 DIAGNOSIS — I10 Essential (primary) hypertension: Secondary | ICD-10-CM | POA: Diagnosis not present

## 2021-07-25 DIAGNOSIS — E785 Hyperlipidemia, unspecified: Secondary | ICD-10-CM | POA: Diagnosis not present

## 2021-07-25 DIAGNOSIS — I10 Essential (primary) hypertension: Secondary | ICD-10-CM | POA: Diagnosis not present

## 2021-07-25 DIAGNOSIS — G4733 Obstructive sleep apnea (adult) (pediatric): Secondary | ICD-10-CM | POA: Diagnosis not present

## 2021-07-25 DIAGNOSIS — E1169 Type 2 diabetes mellitus with other specified complication: Secondary | ICD-10-CM | POA: Diagnosis not present

## 2021-07-25 DIAGNOSIS — R972 Elevated prostate specific antigen [PSA]: Secondary | ICD-10-CM | POA: Diagnosis not present

## 2021-07-25 DIAGNOSIS — M545 Low back pain, unspecified: Secondary | ICD-10-CM | POA: Diagnosis not present

## 2021-07-25 DIAGNOSIS — Z0000 Encounter for general adult medical examination without abnormal findings: Secondary | ICD-10-CM | POA: Diagnosis not present

## 2021-07-25 DIAGNOSIS — J310 Chronic rhinitis: Secondary | ICD-10-CM | POA: Diagnosis not present

## 2021-07-25 DIAGNOSIS — G8929 Other chronic pain: Secondary | ICD-10-CM | POA: Diagnosis not present

## 2021-07-25 DIAGNOSIS — R82998 Other abnormal findings in urine: Secondary | ICD-10-CM | POA: Diagnosis not present

## 2021-08-04 DIAGNOSIS — E669 Obesity, unspecified: Secondary | ICD-10-CM | POA: Diagnosis not present

## 2021-08-04 DIAGNOSIS — G720 Drug-induced myopathy: Secondary | ICD-10-CM | POA: Diagnosis not present

## 2021-08-04 DIAGNOSIS — I10 Essential (primary) hypertension: Secondary | ICD-10-CM | POA: Diagnosis not present

## 2021-08-04 DIAGNOSIS — E785 Hyperlipidemia, unspecified: Secondary | ICD-10-CM | POA: Diagnosis not present

## 2021-08-04 DIAGNOSIS — E1169 Type 2 diabetes mellitus with other specified complication: Secondary | ICD-10-CM | POA: Diagnosis not present

## 2021-09-22 DIAGNOSIS — Z0100 Encounter for examination of eyes and vision without abnormal findings: Secondary | ICD-10-CM | POA: Diagnosis not present

## 2021-09-22 DIAGNOSIS — H52223 Regular astigmatism, bilateral: Secondary | ICD-10-CM | POA: Diagnosis not present

## 2021-11-28 DIAGNOSIS — E785 Hyperlipidemia, unspecified: Secondary | ICD-10-CM | POA: Diagnosis not present

## 2021-11-28 DIAGNOSIS — E1151 Type 2 diabetes mellitus with diabetic peripheral angiopathy without gangrene: Secondary | ICD-10-CM | POA: Diagnosis not present

## 2021-11-28 DIAGNOSIS — I10 Essential (primary) hypertension: Secondary | ICD-10-CM | POA: Diagnosis not present

## 2021-11-28 DIAGNOSIS — G4733 Obstructive sleep apnea (adult) (pediatric): Secondary | ICD-10-CM | POA: Diagnosis not present

## 2021-11-28 DIAGNOSIS — G720 Drug-induced myopathy: Secondary | ICD-10-CM | POA: Diagnosis not present

## 2022-02-15 DIAGNOSIS — D225 Melanocytic nevi of trunk: Secondary | ICD-10-CM | POA: Diagnosis not present

## 2022-02-15 DIAGNOSIS — L739 Follicular disorder, unspecified: Secondary | ICD-10-CM | POA: Diagnosis not present

## 2022-02-15 DIAGNOSIS — L578 Other skin changes due to chronic exposure to nonionizing radiation: Secondary | ICD-10-CM | POA: Diagnosis not present

## 2022-02-15 DIAGNOSIS — D485 Neoplasm of uncertain behavior of skin: Secondary | ICD-10-CM | POA: Diagnosis not present

## 2022-02-15 DIAGNOSIS — L720 Epidermal cyst: Secondary | ICD-10-CM | POA: Diagnosis not present

## 2022-02-15 DIAGNOSIS — L821 Other seborrheic keratosis: Secondary | ICD-10-CM | POA: Diagnosis not present

## 2022-02-15 DIAGNOSIS — L570 Actinic keratosis: Secondary | ICD-10-CM | POA: Diagnosis not present

## 2022-02-15 DIAGNOSIS — D2239 Melanocytic nevi of other parts of face: Secondary | ICD-10-CM | POA: Diagnosis not present

## 2022-07-12 DIAGNOSIS — M25569 Pain in unspecified knee: Secondary | ICD-10-CM | POA: Insufficient documentation

## 2022-07-12 DIAGNOSIS — M1711 Unilateral primary osteoarthritis, right knee: Secondary | ICD-10-CM | POA: Diagnosis not present

## 2022-07-12 DIAGNOSIS — M25561 Pain in right knee: Secondary | ICD-10-CM | POA: Diagnosis not present

## 2022-07-19 DIAGNOSIS — Z1212 Encounter for screening for malignant neoplasm of rectum: Secondary | ICD-10-CM | POA: Diagnosis not present

## 2022-07-24 DIAGNOSIS — Z125 Encounter for screening for malignant neoplasm of prostate: Secondary | ICD-10-CM | POA: Diagnosis not present

## 2022-07-24 DIAGNOSIS — E785 Hyperlipidemia, unspecified: Secondary | ICD-10-CM | POA: Diagnosis not present

## 2022-07-24 DIAGNOSIS — I10 Essential (primary) hypertension: Secondary | ICD-10-CM | POA: Diagnosis not present

## 2022-07-24 DIAGNOSIS — E1169 Type 2 diabetes mellitus with other specified complication: Secondary | ICD-10-CM | POA: Diagnosis not present

## 2022-07-24 DIAGNOSIS — R7989 Other specified abnormal findings of blood chemistry: Secondary | ICD-10-CM | POA: Diagnosis not present

## 2022-07-31 DIAGNOSIS — R82998 Other abnormal findings in urine: Secondary | ICD-10-CM | POA: Diagnosis not present

## 2022-07-31 DIAGNOSIS — R3911 Hesitancy of micturition: Secondary | ICD-10-CM | POA: Diagnosis not present

## 2022-07-31 DIAGNOSIS — E785 Hyperlipidemia, unspecified: Secondary | ICD-10-CM | POA: Diagnosis not present

## 2022-07-31 DIAGNOSIS — E1151 Type 2 diabetes mellitus with diabetic peripheral angiopathy without gangrene: Secondary | ICD-10-CM | POA: Diagnosis not present

## 2022-07-31 DIAGNOSIS — G4733 Obstructive sleep apnea (adult) (pediatric): Secondary | ICD-10-CM | POA: Diagnosis not present

## 2022-07-31 DIAGNOSIS — Z1339 Encounter for screening examination for other mental health and behavioral disorders: Secondary | ICD-10-CM | POA: Diagnosis not present

## 2022-07-31 DIAGNOSIS — I10 Essential (primary) hypertension: Secondary | ICD-10-CM | POA: Diagnosis not present

## 2022-07-31 DIAGNOSIS — G720 Drug-induced myopathy: Secondary | ICD-10-CM | POA: Diagnosis not present

## 2022-07-31 DIAGNOSIS — Z1331 Encounter for screening for depression: Secondary | ICD-10-CM | POA: Diagnosis not present

## 2022-07-31 DIAGNOSIS — Z0000 Encounter for general adult medical examination without abnormal findings: Secondary | ICD-10-CM | POA: Diagnosis not present

## 2022-08-23 DIAGNOSIS — Z1211 Encounter for screening for malignant neoplasm of colon: Secondary | ICD-10-CM | POA: Diagnosis not present

## 2022-11-20 DIAGNOSIS — M1711 Unilateral primary osteoarthritis, right knee: Secondary | ICD-10-CM | POA: Diagnosis not present

## 2022-12-06 DIAGNOSIS — H5203 Hypermetropia, bilateral: Secondary | ICD-10-CM | POA: Diagnosis not present

## 2022-12-06 DIAGNOSIS — Z0100 Encounter for examination of eyes and vision without abnormal findings: Secondary | ICD-10-CM | POA: Diagnosis not present

## 2023-01-22 DIAGNOSIS — S0501XA Injury of conjunctiva and corneal abrasion without foreign body, right eye, initial encounter: Secondary | ICD-10-CM | POA: Diagnosis not present

## 2023-02-21 DIAGNOSIS — L570 Actinic keratosis: Secondary | ICD-10-CM | POA: Diagnosis not present

## 2023-02-21 DIAGNOSIS — D2239 Melanocytic nevi of other parts of face: Secondary | ICD-10-CM | POA: Diagnosis not present

## 2023-02-21 DIAGNOSIS — L821 Other seborrheic keratosis: Secondary | ICD-10-CM | POA: Diagnosis not present

## 2023-02-21 DIAGNOSIS — L578 Other skin changes due to chronic exposure to nonionizing radiation: Secondary | ICD-10-CM | POA: Diagnosis not present

## 2023-02-21 DIAGNOSIS — D225 Melanocytic nevi of trunk: Secondary | ICD-10-CM | POA: Diagnosis not present

## 2023-02-21 DIAGNOSIS — L720 Epidermal cyst: Secondary | ICD-10-CM | POA: Diagnosis not present

## 2023-06-20 ENCOUNTER — Other Ambulatory Visit: Payer: Self-pay | Admitting: Registered Nurse

## 2023-06-20 ENCOUNTER — Ambulatory Visit
Admission: RE | Admit: 2023-06-20 | Discharge: 2023-06-20 | Disposition: A | Discharge status: Home / Self Care | Payer: Medicare HMO | Source: Ambulatory Visit | Attending: Registered Nurse | Admitting: Registered Nurse

## 2023-06-20 DIAGNOSIS — K5790 Diverticulosis of intestine, part unspecified, without perforation or abscess without bleeding: Secondary | ICD-10-CM | POA: Diagnosis not present

## 2023-06-20 DIAGNOSIS — R110 Nausea: Secondary | ICD-10-CM | POA: Diagnosis not present

## 2023-06-20 DIAGNOSIS — R1032 Left lower quadrant pain: Secondary | ICD-10-CM | POA: Diagnosis not present

## 2023-06-20 DIAGNOSIS — R198 Other specified symptoms and signs involving the digestive system and abdomen: Secondary | ICD-10-CM | POA: Diagnosis not present

## 2023-06-20 DIAGNOSIS — N410 Acute prostatitis: Secondary | ICD-10-CM | POA: Diagnosis not present

## 2023-06-20 MED ORDER — IOPAMIDOL (ISOVUE-300) INJECTION 61%
100.0000 mL | Freq: Once | INTRAVENOUS | Status: AC | PRN
  Administered 2023-06-20: 100 mL via INTRAVENOUS

## 2023-06-26 DIAGNOSIS — E785 Hyperlipidemia, unspecified: Secondary | ICD-10-CM | POA: Diagnosis not present

## 2023-06-26 DIAGNOSIS — K5901 Slow transit constipation: Secondary | ICD-10-CM | POA: Diagnosis not present

## 2023-06-26 DIAGNOSIS — I10 Essential (primary) hypertension: Secondary | ICD-10-CM | POA: Diagnosis not present

## 2023-06-26 DIAGNOSIS — G4733 Obstructive sleep apnea (adult) (pediatric): Secondary | ICD-10-CM | POA: Diagnosis not present

## 2023-06-26 DIAGNOSIS — F418 Other specified anxiety disorders: Secondary | ICD-10-CM | POA: Diagnosis not present

## 2023-06-26 DIAGNOSIS — I739 Peripheral vascular disease, unspecified: Secondary | ICD-10-CM | POA: Diagnosis not present

## 2023-06-26 DIAGNOSIS — G720 Drug-induced myopathy: Secondary | ICD-10-CM | POA: Diagnosis not present

## 2023-06-26 DIAGNOSIS — M5416 Radiculopathy, lumbar region: Secondary | ICD-10-CM | POA: Diagnosis not present

## 2023-06-26 DIAGNOSIS — E1151 Type 2 diabetes mellitus with diabetic peripheral angiopathy without gangrene: Secondary | ICD-10-CM | POA: Diagnosis not present

## 2023-08-29 DIAGNOSIS — Z0100 Encounter for examination of eyes and vision without abnormal findings: Secondary | ICD-10-CM | POA: Diagnosis not present

## 2023-09-04 DIAGNOSIS — E1151 Type 2 diabetes mellitus with diabetic peripheral angiopathy without gangrene: Secondary | ICD-10-CM | POA: Diagnosis not present

## 2023-09-04 DIAGNOSIS — Z1212 Encounter for screening for malignant neoplasm of rectum: Secondary | ICD-10-CM | POA: Diagnosis not present

## 2023-09-04 DIAGNOSIS — R972 Elevated prostate specific antigen [PSA]: Secondary | ICD-10-CM | POA: Diagnosis not present

## 2023-09-04 DIAGNOSIS — E785 Hyperlipidemia, unspecified: Secondary | ICD-10-CM | POA: Diagnosis not present

## 2023-09-04 DIAGNOSIS — I10 Essential (primary) hypertension: Secondary | ICD-10-CM | POA: Diagnosis not present

## 2023-09-11 DIAGNOSIS — I739 Peripheral vascular disease, unspecified: Secondary | ICD-10-CM | POA: Diagnosis not present

## 2023-09-11 DIAGNOSIS — E1151 Type 2 diabetes mellitus with diabetic peripheral angiopathy without gangrene: Secondary | ICD-10-CM | POA: Diagnosis not present

## 2023-09-11 DIAGNOSIS — R82998 Other abnormal findings in urine: Secondary | ICD-10-CM | POA: Diagnosis not present

## 2023-09-11 DIAGNOSIS — G4733 Obstructive sleep apnea (adult) (pediatric): Secondary | ICD-10-CM | POA: Diagnosis not present

## 2023-09-11 DIAGNOSIS — K5901 Slow transit constipation: Secondary | ICD-10-CM | POA: Diagnosis not present

## 2023-09-11 DIAGNOSIS — Z0000 Encounter for general adult medical examination without abnormal findings: Secondary | ICD-10-CM | POA: Diagnosis not present

## 2023-09-11 DIAGNOSIS — Z6837 Body mass index (BMI) 37.0-37.9, adult: Secondary | ICD-10-CM | POA: Diagnosis not present

## 2023-09-11 DIAGNOSIS — R972 Elevated prostate specific antigen [PSA]: Secondary | ICD-10-CM | POA: Diagnosis not present

## 2023-09-11 DIAGNOSIS — G720 Drug-induced myopathy: Secondary | ICD-10-CM | POA: Diagnosis not present

## 2023-09-11 DIAGNOSIS — I10 Essential (primary) hypertension: Secondary | ICD-10-CM | POA: Diagnosis not present

## 2023-09-11 DIAGNOSIS — E785 Hyperlipidemia, unspecified: Secondary | ICD-10-CM | POA: Diagnosis not present

## 2023-09-14 DIAGNOSIS — R69 Illness, unspecified: Secondary | ICD-10-CM | POA: Diagnosis not present

## 2023-10-22 DIAGNOSIS — M1711 Unilateral primary osteoarthritis, right knee: Secondary | ICD-10-CM | POA: Diagnosis not present

## 2023-11-14 DIAGNOSIS — I739 Peripheral vascular disease, unspecified: Secondary | ICD-10-CM | POA: Diagnosis not present

## 2023-11-14 DIAGNOSIS — M25561 Pain in right knee: Secondary | ICD-10-CM | POA: Diagnosis not present

## 2023-11-14 DIAGNOSIS — E785 Hyperlipidemia, unspecified: Secondary | ICD-10-CM | POA: Diagnosis not present

## 2023-11-14 DIAGNOSIS — I10 Essential (primary) hypertension: Secondary | ICD-10-CM | POA: Diagnosis not present

## 2023-11-14 DIAGNOSIS — E1169 Type 2 diabetes mellitus with other specified complication: Secondary | ICD-10-CM | POA: Diagnosis not present

## 2023-11-14 DIAGNOSIS — G720 Drug-induced myopathy: Secondary | ICD-10-CM | POA: Diagnosis not present

## 2023-11-14 DIAGNOSIS — M25661 Stiffness of right knee, not elsewhere classified: Secondary | ICD-10-CM | POA: Diagnosis not present

## 2023-11-15 DIAGNOSIS — M25561 Pain in right knee: Secondary | ICD-10-CM | POA: Insufficient documentation

## 2023-11-15 DIAGNOSIS — M25661 Stiffness of right knee, not elsewhere classified: Secondary | ICD-10-CM | POA: Insufficient documentation

## 2023-12-05 DIAGNOSIS — M25561 Pain in right knee: Secondary | ICD-10-CM | POA: Diagnosis not present

## 2023-12-05 DIAGNOSIS — M25661 Stiffness of right knee, not elsewhere classified: Secondary | ICD-10-CM | POA: Diagnosis not present

## 2023-12-10 ENCOUNTER — Ambulatory Visit: Payer: Self-pay | Admitting: Orthopedic Surgery

## 2023-12-14 NOTE — Progress Notes (Addendum)
 PCP - Maryln Gottron  clearance 2023 in media  lov 07-31-22 in media Cardiologist - no  PPM/ICD -  Device Orders -  Rep Notified -   Chest x-ray -  EKG - 12-19-23 epic Stress Test -  ECHO -  Cardiac Cath -   Sleep Study -  yes  Fasting Blood Sugar -  Checks Blood Sugar _0____ times a day  Blood Thinner Instructions: Aspirin Instructions:  ERAS Protcol - PRE-SURGERY G2-    COVID vaccine -yes  Activity--Able to climb a flight of stairs without CP or SOB Anesthesia review: HTN,DM OSA  Patient denies shortness of breath, fever, cough and chest pain at PAT appointment   All instructions explained to the patient, with a verbal understanding of the material. Patient agrees to go over the instructions while at home for a better understanding. Patient also instructed to self quarantine after being tested for COVID-19. The opportunity to ask questions was provided.

## 2023-12-14 NOTE — Patient Instructions (Signed)
 SURGICAL WAITING ROOM VISITATION  Patients having surgery or a procedure may have no more than 2 support people in the waiting area - these visitors may rotate.    Children under the age of 39 must have an adult with them who is not the patient.  Due to an increase in RSV and influenza rates and associated hospitalizations, children ages 55 and under may not visit patients in [facility] hospitals.  Visitors with respiratory illnesses are discouraged from visiting and should remain at home.  If the patient needs to stay at the hospital during part of their recovery, the visitor guidelines for inpatient rooms apply. Pre-op nurse will coordinate an appropriate time for 1 support person to accompany patient in pre-op.  This support person may not rotate.    Please refer to the [facility] website for the visitor guidelines for Inpatients (after your surgery is over and you are in a regular room).       Your procedure is scheduled on: 12-26-23    Report to [facility] Main Entrance    Report to admitting at       [phone] AM   Call this number if you have problems the morning of surgery [phone]   Do not eat food :After Midnight.   After Midnight you may have the following liquids until __0900____ AM/ DAY OF SURGERY  then nothing by mouth  Water Non-Citrus Juices (without pulp, NO RED-Apple, White grape, White cranberry) Black Coffee (NO MILK/CREAM OR CREAMERS, sugar ok)  Clear Tea (NO MILK/CREAM OR CREAMERS, sugar ok) regular and decaf                             Plain Jell-O (NO RED)                                           Fruit ices (not with fruit pulp, NO RED)                                     Popsicles (NO RED)                                                               Sports drinks like Gatorade (NO RED)                    The day of surgery:  Drink ONE (1) Pre-Surgery Clear Ensure BY 0900  AM the morning of surgery. Drink in one sitting. Do not sip.  This  drink was given to you during your hospital  pre-op appointment visit. Nothing else to drink after completing the  Pre-Surgery G2.          If you have questions, please contact your surgeon's office.   FOLLOW BOWEL PREP AND ANY ADDITIONAL PRE OP INSTRUCTIONS YOU RECEIVED FROM YOUR SURGEON'S OFFICE!!!     Oral Hygiene is also important to reduce your risk of infection.  Remember - BRUSH YOUR TEETH THE MORNING OF SURGERY WITH YOUR REGULAR TOOTHPASTE  DENTURES WILL BE REMOVED PRIOR TO SURGERY PLEASE DO NOT APPLY "Poly grip" OR ADHESIVES!!!   Do NOT smoke after Midnight   Stop all vitamins and herbal supplements 7 days before surgery.   Take these medicines the morning of surgery with A SIP OF WATER: propranolol and hydrocodone or tylenol if needed  DO NOT TAKE ANY ORAL DIABETIC MEDICATIONS DAY OF YOUR SURGERY  Bring CPAP mask and tubing day of surgery.                              You may not have any metal on your body including hair pins, jewelry, and body piercing             Do not wear  lotions, powders, perfumes/cologne, or deodorant              Men may shave face and neck.   Do not bring valuables to the hospital. [facility] IS NOT             RESPONSIBLE   FOR VALUABLES.   Contacts, glasses, dentures or bridgework may not be worn into surgery.   Bring small overnight bag day of surgery.   DO NOT BRING YOUR HOME MEDICATIONS TO THE HOSPITAL. PHARMACY WILL DISPENSE MEDICATIONS LISTED ON YOUR MEDICATION LIST TO YOU DURING YOUR ADMISSION IN THE HOSPITAL!    Patients discharged on the day of surgery will not be allowed to drive home.  Someone NEEDS to stay with you for the first 24 hours after anesthesia.   Special Instructions: Bring a copy of your healthcare power of attorney and living will documents the day of surgery if you haven't scanned them before.              Please read over the following fact sheets you were given: IF YOU  HAVE QUESTIONS ABOUT YOUR PRE-OP INSTRUCTIONS PLEASE CALL [phone]    If you test positive for Covid or have been in contact with anyone that has tested positive in the last 10 days please notify you surgeon.      Pre-operative 5 CHG Bath Instructions   You can play a key role in reducing the risk of infection after surgery. Your skin needs to be as free of germs as possible. You can reduce the number of germs on your skin by washing with CHG (chlorhexidine gluconate) soap before surgery. CHG is an antiseptic soap that kills germs and continues to kill germs even after washing.   DO NOT use if you have an allergy to chlorhexidine/CHG or antibacterial soaps. If your skin becomes reddened or irritated, stop using the CHG and notify one of our RNs at [phone].   Please shower with the CHG soap starting 4 days before surgery using the following schedule:     Please keep in mind the following:  DO NOT shave, including legs and underarms, starting the day of your first shower.   You may shave your face at any point before/day of surgery.  Place clean sheets on your bed the day you start using CHG soap. Use a clean washcloth (not used since being washed) for each shower. DO NOT sleep with pets once you start using the CHG.   CHG Shower Instructions:  If you choose to wash your hair and private area, wash first with your normal shampoo/soap.  After you  use shampoo/soap, rinse your hair and body thoroughly to remove shampoo/soap residue.  Turn the water OFF and apply about 3 tablespoons (45 ml) of CHG soap to a CLEAN washcloth.  Apply CHG soap ONLY FROM YOUR NECK DOWN TO YOUR TOES (washing for 3-5 minutes)  DO NOT use CHG soap on face, private areas, open wounds, or sores.  Pay special attention to the area where your surgery is being performed.  If you are having back surgery, having someone wash your back for you may be helpful. Wait 2 minutes after CHG soap is applied, then you may  rinse off the CHG soap.  Pat dry with a clean towel  Put on clean clothes/pajamas   If you choose to wear lotion, please use ONLY the CHG-compatible lotions on the back of this paper.     Additional instructions for the day of surgery: DO NOT APPLY any lotions, deodorants, cologne, or perfumes.   Put on clean/comfortable clothes.  Brush your teeth.  Ask your nurse before applying any prescription medications to the skin.      CHG Compatible Lotions   Aveeno Moisturizing lotion  Cetaphil Moisturizing Cream  Cetaphil Moisturizing Lotion  Clairol Herbal Essence Moisturizing Lotion, Dry Skin  Clairol Herbal Essence Moisturizing Lotion, Extra Dry Skin  Clairol Herbal Essence Moisturizing Lotion, Normal Skin  Curel Age Defying Therapeutic Moisturizing Lotion with Alpha Hydroxy  Curel Extreme Care Body Lotion  Curel Soothing Hands Moisturizing Hand Lotion  Curel Therapeutic Moisturizing Cream, Fragrance-Free  Curel Therapeutic Moisturizing Lotion, Fragrance-Free  Curel Therapeutic Moisturizing Lotion, Original Formula  Eucerin Daily Replenishing Lotion  Eucerin Dry Skin Therapy Plus Alpha Hydroxy Crme  Eucerin Dry Skin Therapy Plus Alpha Hydroxy Lotion  Eucerin Original Crme  Eucerin Original Lotion  Eucerin Plus Crme Eucerin Plus Lotion  Eucerin TriLipid Replenishing Lotion  Keri Anti-Bacterial Hand Lotion  Keri Deep Conditioning Original Lotion Dry Skin Formula Softly Scented  Keri Deep Conditioning Original Lotion, Fragrance Free Sensitive Skin Formula  Keri Lotion Fast Absorbing Fragrance Free Sensitive Skin Formula  Keri Lotion Fast Absorbing Softly Scented Dry Skin Formula  Keri Original Lotion  Keri Skin Renewal Lotion Keri Silky Smooth Lotion  Keri Silky Smooth Sensitive Skin Lotion  Nivea Body Creamy Conditioning Oil  Nivea Body Extra Enriched Lotion  Nivea Body Original Lotion  Nivea Body Sheer Moisturizing Lotion Nivea Crme  Nivea Skin Firming Lotion   NutraDerm 30 Skin Lotion  NutraDerm Skin Lotion  NutraDerm Therapeutic Skin Cream  NutraDerm Therapeutic Skin Lotion  ProShield Protective Hand Cream  Provon moisturizing lotion   Incentive Spirometer  An incentive spirometer is a tool that can help keep your lungs clear and active. This tool measures how well you are filling your lungs with each breath. Taking long deep breaths may help reverse or decrease the chance of developing breathing (pulmonary) problems (especially infection) following: A long period of time when you are unable to move or be active. BEFORE THE PROCEDURE  If the spirometer includes an indicator to show your best effort, your nurse or respiratory therapist will set it to a desired goal. If possible, sit up straight or lean slightly forward. Try not to slouch. Hold the incentive spirometer in an upright position. INSTRUCTIONS FOR USE  Sit on the edge of your bed if possible, or sit up as far as you can in bed or on a chair. Hold the incentive spirometer in an upright position. Breathe out normally. Place the mouthpiece in your mouth and seal your  lips tightly around it. Breathe in slowly and as deeply as possible, raising the piston or the ball toward the top of the column. Hold your breath for 3-5 seconds or for as long as possible. Allow the piston or ball to fall to the bottom of the column. Remove the mouthpiece from your mouth and breathe out normally. Rest for a few seconds and repeat Steps 1 through 7 at least 10 times every 1-2 hours when you are awake. Take your time and take a few normal breaths between deep breaths. The spirometer may include an indicator to show your best effort. Use the indicator as a goal to work toward during each repetition. After each set of 10 deep breaths, practice coughing to be sure your lungs are clear. If you have an incision (the cut made at the time of surgery), support your incision when coughing by placing a pillow or rolled  up towels firmly against it. Once you are able to get out of bed, walk around indoors and cough well. You may stop using the incentive spirometer when instructed by your caregiver.  RISKS AND COMPLICATIONS Take your time so you do not get dizzy or light-headed. If you are in pain, you may need to take or ask for pain medication before doing incentive spirometry. It is harder to take a deep breath if you are having pain. AFTER USE Rest and breathe slowly and easily. It can be helpful to keep track of a log of your progress. Your caregiver can provide you with a simple table to help with this. If you are using the spirometer at home, follow these instructions: SEEK MEDICAL CARE IF:  You are having difficultly using the spirometer. You have trouble using the spirometer as often as instructed. Your pain medication is not giving enough relief while using the spirometer. You develop fever of 100.5 F (38.1 C) or higher. SEEK IMMEDIATE MEDICAL CARE IF:  You cough up bloody sputum that had not been present before. You develop fever of 102 F (38.9 C) or greater. You develop worsening pain at or near the incision site. MAKE SURE YOU:  Understand these instructions. Will watch your condition. Will get help right away if you are not doing well or get worse. Document Released: 02/12/2007 Document Revised: 12/25/2011 Document Reviewed: 04/15/2007 [facility] Patient Information 2014 [place], [place].   ________________________________________________________________________

## 2023-12-17 ENCOUNTER — Ambulatory Visit: Payer: Self-pay | Admitting: Orthopedic Surgery

## 2023-12-17 NOTE — H&P (Signed)
 Jeffrey Banks is an 73 y.o. male.   Chief Complaint: right knee pain HPI: Patient is here for his H&P. Patient is scheduled for a RTKA by Dr. Shelle Iron on 12/26/23 at [facility]  Dr. Shelle Iron and the patient mutually agreed to proceed with a total knee replacement. Risks and benefits of the procedure were discussed including stiffness, suboptimal range of motion, persistent pain, infection requiring removal of prosthesis and reinsertion, need for prophylactic antibiotics in the future, for example, dental procedures, possible need for manipulation, revision in the future and also anesthetic complications including DVT, PE, etc. We discussed the perioperative course, time in the hospital, postoperative recovery and the need for elevation to control swelling. We also discussed the predicted range of motion and the probability that squatting and kneeling would be unobtainable in the future. In addition, postoperative anticoagulation was discussed. We have obtained preoperative medical clearance as necessary. Provided illustrated handout and discussed it in detail. They will enroll in the total joint replacement educational forum at the hospital.  Recalls postop constipation when we did his other knee in 2016.  Past Medical History:  Diagnosis Date   Arthritis    Back pain 2009   Cough    Depression    Diabetes mellitus without complication (HCC)    borderline    Hyperlipidemia    Impaired fasting glucose 2014   OSA (obstructive sleep apnea) 2006   on CPAP does not know settings    Rhinitis    Stenosis of cervical spine     Past Surgical History:  Procedure Laterality Date   bilateral cataract surgery      with lens implants    lamenectomy  2010   TONSILLECTOMY  1969   TOTAL KNEE ARTHROPLASTY Left 05/06/2015   Procedure: LEFT TOTAL KNEE ARTHROPLASTY;  Surgeon: Jene Every, MD;  Location: [facility];  Service: Orthopedics;  Laterality: Left;    Family History  Problem Relation Age of  Onset   High Cholesterol Mother    Heart failure Father    Social History:  reports that he has quit smoking. He has quit using smokeless tobacco. He reports that he does not drink alcohol and does not use drugs.  Allergies: No Known Allergies  Current meds: Allegra Allergy HYDROcodone 5 mg-acetaminophen 325 mg tablet Movantik pioglitazone 15 mg tablet propranoloL Repatha SureClick  Review of Systems  Constitutional: Negative.   HENT: Negative.    Eyes: Negative.   Respiratory: Negative.    Cardiovascular: Negative.   Endocrine: Negative.   Genitourinary: Negative.   Musculoskeletal:  Positive for arthralgias, back pain, gait problem and joint swelling.  Skin: Negative.   Psychiatric/Behavioral: Negative.      There were no vitals taken for this visit. Physical Exam Constitutional:      Appearance: Normal appearance.  HENT:     Head: Normocephalic and atraumatic.     Right Ear: External ear normal.     Left Ear: External ear normal.     Nose: Nose normal.     Mouth/Throat:     Pharynx: Oropharynx is clear.  Eyes:     Conjunctiva/sclera: Conjunctivae normal.  Cardiovascular:     Rate and Rhythm: Normal rate and regular rhythm.     Pulses: Normal pulses.     Heart sounds: Normal heart sounds.  Pulmonary:     Effort: Pulmonary effort is normal.     Breath sounds: Normal breath sounds.  Abdominal:     General: Bowel sounds are normal.  Musculoskeletal:     Cervical back: Normal range of motion.     Comments: Patient is awake, alert, oriented 3. Overweight. Antalgic gait. Ambulates with the use of a cane  On examination of the right knee, tender on palpation of the medial joint line. Nontender lateral joint line, patellar tendon, quadriceps tendon, patella, peroneal nerve and popliteal space. No calf pain or sign of DVT. Obvious varus deformity. No pain or laxity with varus or valgus stress. No instability noted. Painful medial McMurray's. Trace effusion noted.  Range of motion -5 to 100 degrees, notes tightness with flexion. Positive patellofemoral crepitus. No patellofemoral pain on compression. Sensation intact distally.  Neurological:     Mental Status: He is alert.    X-rays reviewed. End-stage medial joint space narrowing right knee, bone-on-bone with varus deformity. Status post left total knee replacement with prosthesis in excellent alignment with no signs of osteolysis or loosening.  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 Shelle Iron on March 12. We again discussed the procedure itself as well as risks, complications and alternatives, including but not limited to DVT, PE, infx, bleeding, failure of procedure, need for secondary procedure including manipulation, nerve injury, ongoing pain/symptoms, anesthesia risk, even stroke or death. Also discussed typical post-op protocols, activity restrictions, need for PT, flexion/extension exercises, time out of work. Discussed need for DVT ppx post-op per protocol. Discussed dental ppx and infx prevention. Also discussed limitations post-operatively such as kneeling and squatting. All questions were answered. Patient desires to proceed with surgery as scheduled.  Will hold supplements, ASA and NSAIDs accordingly. Will remain NPO after midnight the night before surgery. Will present to [facility] for pre-op testing. Anticipate hospital stay to include at least 2 midnights given medical history and to ensure proper pain control. Plan aspirin for DVT ppx post-op. Plan oxycodone Robaxin, Colace, Miralax, may consider sending home with magnesium citrate or another laxative. Plan outpatient PT postoperatively. Will follow up 10-14 days post-op for staple removal and xrays.  Plan Right total knee replacement   Dorothy Spark, PA-C  for Dr Shelle Iron 12/17/2023, 11:50 AM

## 2023-12-17 NOTE — H&P (View-Only) (Signed)
 Jeffrey Banks is an 73 y.o. male.   Chief Complaint: right knee pain HPI: Patient is here for his H&P. Patient is scheduled for a RTKA by Dr. Shelle Iron on 12/26/23 at [facility]  Dr. Shelle Iron and the patient mutually agreed to proceed with a total knee replacement. Risks and benefits of the procedure were discussed including stiffness, suboptimal range of motion, persistent pain, infection requiring removal of prosthesis and reinsertion, need for prophylactic antibiotics in the future, for example, dental procedures, possible need for manipulation, revision in the future and also anesthetic complications including DVT, PE, etc. We discussed the perioperative course, time in the hospital, postoperative recovery and the need for elevation to control swelling. We also discussed the predicted range of motion and the probability that squatting and kneeling would be unobtainable in the future. In addition, postoperative anticoagulation was discussed. We have obtained preoperative medical clearance as necessary. Provided illustrated handout and discussed it in detail. They will enroll in the total joint replacement educational forum at the hospital.  Recalls postop constipation when we did his other knee in 2016.  Past Medical History:  Diagnosis Date   Arthritis    Back pain 2009   Cough    Depression    Diabetes mellitus without complication (HCC)    borderline    Hyperlipidemia    Impaired fasting glucose 2014   OSA (obstructive sleep apnea) 2006   on CPAP does not know settings    Rhinitis    Stenosis of cervical spine     Past Surgical History:  Procedure Laterality Date   bilateral cataract surgery      with lens implants    lamenectomy  2010   TONSILLECTOMY  1969   TOTAL KNEE ARTHROPLASTY Left 05/06/2015   Procedure: LEFT TOTAL KNEE ARTHROPLASTY;  Surgeon: Jene Every, MD;  Location: [facility];  Service: Orthopedics;  Laterality: Left;    Family History  Problem Relation Age of  Onset   High Cholesterol Mother    Heart failure Father    Social History:  reports that he has quit smoking. He has quit using smokeless tobacco. He reports that he does not drink alcohol and does not use drugs.  Allergies: No Known Allergies  Current meds: Allegra Allergy HYDROcodone 5 mg-acetaminophen 325 mg tablet Movantik pioglitazone 15 mg tablet propranoloL Repatha SureClick  Review of Systems  Constitutional: Negative.   HENT: Negative.    Eyes: Negative.   Respiratory: Negative.    Cardiovascular: Negative.   Endocrine: Negative.   Genitourinary: Negative.   Musculoskeletal:  Positive for arthralgias, back pain, gait problem and joint swelling.  Skin: Negative.   Psychiatric/Behavioral: Negative.      There were no vitals taken for this visit. Physical Exam Constitutional:      Appearance: Normal appearance.  HENT:     Head: Normocephalic and atraumatic.     Right Ear: External ear normal.     Left Ear: External ear normal.     Nose: Nose normal.     Mouth/Throat:     Pharynx: Oropharynx is clear.  Eyes:     Conjunctiva/sclera: Conjunctivae normal.  Cardiovascular:     Rate and Rhythm: Normal rate and regular rhythm.     Pulses: Normal pulses.     Heart sounds: Normal heart sounds.  Pulmonary:     Effort: Pulmonary effort is normal.     Breath sounds: Normal breath sounds.  Abdominal:     General: Bowel sounds are normal.  Musculoskeletal:     Cervical back: Normal range of motion.     Comments: Patient is awake, alert, oriented 3. Overweight. Antalgic gait. Ambulates with the use of a cane  On examination of the right knee, tender on palpation of the medial joint line. Nontender lateral joint line, patellar tendon, quadriceps tendon, patella, peroneal nerve and popliteal space. No calf pain or sign of DVT. Obvious varus deformity. No pain or laxity with varus or valgus stress. No instability noted. Painful medial McMurray's. Trace effusion noted.  Range of motion -5 to 100 degrees, notes tightness with flexion. Positive patellofemoral crepitus. No patellofemoral pain on compression. Sensation intact distally.  Neurological:     Mental Status: He is alert.    X-rays reviewed. End-stage medial joint space narrowing right knee, bone-on-bone with varus deformity. Status post left total knee replacement with prosthesis in excellent alignment with no signs of osteolysis or loosening.  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 Shelle Iron on March 12. We again discussed the procedure itself as well as risks, complications and alternatives, including but not limited to DVT, PE, infx, bleeding, failure of procedure, need for secondary procedure including manipulation, nerve injury, ongoing pain/symptoms, anesthesia risk, even stroke or death. Also discussed typical post-op protocols, activity restrictions, need for PT, flexion/extension exercises, time out of work. Discussed need for DVT ppx post-op per protocol. Discussed dental ppx and infx prevention. Also discussed limitations post-operatively such as kneeling and squatting. All questions were answered. Patient desires to proceed with surgery as scheduled.  Will hold supplements, ASA and NSAIDs accordingly. Will remain NPO after midnight the night before surgery. Will present to [facility] for pre-op testing. Anticipate hospital stay to include at least 2 midnights given medical history and to ensure proper pain control. Plan aspirin for DVT ppx post-op. Plan oxycodone Robaxin, Colace, Miralax, may consider sending home with magnesium citrate or another laxative. Plan outpatient PT postoperatively. Will follow up 10-14 days post-op for staple removal and xrays.  Plan Right total knee replacement   Dorothy Spark, PA-C  for Dr Shelle Iron 12/17/2023, 11:50 AM

## 2023-12-18 DIAGNOSIS — R972 Elevated prostate specific antigen [PSA]: Secondary | ICD-10-CM | POA: Diagnosis not present

## 2023-12-19 ENCOUNTER — Encounter (HOSPITAL_COMMUNITY): Payer: Self-pay

## 2023-12-19 ENCOUNTER — Other Ambulatory Visit: Payer: Self-pay

## 2023-12-19 ENCOUNTER — Encounter (HOSPITAL_COMMUNITY)
Admission: RE | Admit: 2023-12-19 | Discharge: 2023-12-19 | Disposition: A | Discharge status: Home / Self Care | Payer: Medicare HMO | Source: Ambulatory Visit | Attending: Specialist | Admitting: Specialist

## 2023-12-19 VITALS — BP 156/78 | HR 66 | Temp 98.3°F | Resp 17 | Ht 68.3 in | Wt 256.8 lb

## 2023-12-19 DIAGNOSIS — E119 Type 2 diabetes mellitus without complications: Secondary | ICD-10-CM | POA: Insufficient documentation

## 2023-12-19 DIAGNOSIS — Z01818 Encounter for other preprocedural examination: Secondary | ICD-10-CM | POA: Diagnosis not present

## 2023-12-19 HISTORY — DX: Myoneural disorder, unspecified: G70.9

## 2023-12-19 LAB — SURGICAL PCR SCREEN
MRSA, PCR: NEGATIVE
Staphylococcus aureus: NEGATIVE

## 2023-12-19 LAB — CBC
HCT: 44.6 % (ref 39.0–52.0)
Hemoglobin: 14.2 g/dL (ref 13.0–17.0)
MCH: 29.4 pg (ref 26.0–34.0)
MCHC: 31.8 g/dL (ref 30.0–36.0)
MCV: 92.3 fL (ref 80.0–100.0)
Platelets: 256 10*3/uL (ref 150–400)
RBC: 4.83 MIL/uL (ref 4.22–5.81)
RDW: 13.1 % (ref 11.5–15.5)
WBC: 5.4 10*3/uL (ref 4.0–10.5)
nRBC: 0 % (ref 0.0–0.2)

## 2023-12-19 LAB — BASIC METABOLIC PANEL
Anion gap: 10 (ref 5–15)
BUN: 27 mg/dL — ABNORMAL HIGH (ref 8–23)
CO2: 25 mmol/L (ref 22–32)
Calcium: 8.9 mg/dL (ref 8.9–10.3)
Chloride: 104 mmol/L (ref 98–111)
Creatinine, Ser: 0.76 mg/dL (ref 0.61–1.24)
GFR, Estimated: >60 mL/min (ref >60)
Glucose, Bld: 145 mg/dL — ABNORMAL HIGH (ref 70–99)
Potassium: 4.3 mmol/L (ref 3.5–5.1)
Sodium: 139 mmol/L (ref 135–145)

## 2023-12-19 LAB — HEMOGLOBIN A1C
Hgb A1c MFr Bld: 5.6 % (ref 4.8–5.6)
Mean Plasma Glucose: 114.02 mg/dL

## 2023-12-19 LAB — GLUCOSE, CAPILLARY: Glucose-Capillary: 160 mg/dL — ABNORMAL HIGH (ref 70–99)

## 2023-12-24 ENCOUNTER — Ambulatory Visit: Payer: Self-pay | Admitting: Orthopedic Surgery

## 2023-12-25 NOTE — Anesthesia Preprocedure Evaluation (Signed)
 Anesthesia Evaluation  Patient identified by MRN, date of birth, ID band Patient awake    Reviewed: Allergy & Precautions, NPO status , Patient's Chart, lab work & pertinent test results  History of Anesthesia Complications Negative for: history of anesthetic complications  Airway Mallampati: III  TM Distance: >3 FB Neck ROM: Full    Dental no notable dental hx. (+) Dental Advisory Given, Implants   Pulmonary sleep apnea and Continuous Positive Airway Pressure Ventilation , former smoker   Pulmonary exam normal breath sounds clear to auscultation       Cardiovascular hypertension, Pt. on medications (-) angina (-) Past MI Normal cardiovascular exam Rhythm:Regular Rate:Normal     Neuro/Psych  PSYCHIATRIC DISORDERS  Depression       GI/Hepatic Neg liver ROS,,,  Endo/Other  diabetes, Type 2    Renal/GU negative Renal ROSLab Results      Component                Value               Date                        K                        4.3                 12/19/2023                CO2                      25                  12/19/2023                BUN                      27 (H)              12/19/2023                CREATININE               0.76                12/19/2023               negative genitourinary   Musculoskeletal  (+) Arthritis , Osteoarthritis,  Back pain   Abdominal  (+) + obese  Peds negative pediatric ROS (+)  Hematology Lab Results      Component                Value               Date                      WBC                      5.4                 12/19/2023                HGB                      14.2  12/19/2023                HCT                      44.6                12/19/2023                MCV                      92.3                12/19/2023                PLT                      256                 12/19/2023              Anesthesia Other Findings    Reproductive/Obstetrics                             Anesthesia Physical Anesthesia Plan  ASA: 3  Anesthesia Plan: Spinal and Regional   Post-op Pain Management: Regional block* and Minimal or no pain anticipated   Induction:   PONV Risk Score and Plan: 2 and Treatment may vary due to age or medical condition, Ondansetron and TIVA  Airway Management Planned: Nasal Cannula and Natural Airway  Additional Equipment: None  Intra-op Plan:   Post-operative Plan:   Informed Consent: I have reviewed the patients History and Physical, chart, labs and discussed the procedure including the risks, benefits and alternatives for the proposed anesthesia with the patient or authorized representative who has indicated his/her understanding and acceptance.     Dental advisory given  Plan Discussed with: CRNA and Surgeon  Anesthesia Plan Comments: (R TKA under Spinal w R adductor)       Anesthesia Quick Evaluation

## 2023-12-26 ENCOUNTER — Observation Stay (HOSPITAL_COMMUNITY)
Admission: RE | Admit: 2023-12-26 | Discharge: 2023-12-28 | Disposition: A | Discharge status: Home / Self Care | Payer: Medicare HMO | Source: Ambulatory Visit | Attending: Specialist | Admitting: Specialist

## 2023-12-26 ENCOUNTER — Other Ambulatory Visit: Payer: Self-pay

## 2023-12-26 ENCOUNTER — Encounter (HOSPITAL_COMMUNITY)
Admission: RE | Disposition: A | Discharge status: Home / Self Care | Payer: Self-pay | Source: Ambulatory Visit | Attending: Specialist

## 2023-12-26 ENCOUNTER — Ambulatory Visit (HOSPITAL_COMMUNITY): Payer: Self-pay | Admitting: Anesthesiology

## 2023-12-26 ENCOUNTER — Ambulatory Visit (HOSPITAL_COMMUNITY)

## 2023-12-26 ENCOUNTER — Ambulatory Visit (HOSPITAL_BASED_OUTPATIENT_CLINIC_OR_DEPARTMENT_OTHER): Payer: Self-pay | Admitting: Anesthesiology

## 2023-12-26 ENCOUNTER — Encounter (HOSPITAL_COMMUNITY): Payer: Self-pay | Admitting: Specialist

## 2023-12-26 DIAGNOSIS — M21161 Varus deformity, not elsewhere classified, right knee: Secondary | ICD-10-CM | POA: Insufficient documentation

## 2023-12-26 DIAGNOSIS — M1711 Unilateral primary osteoarthritis, right knee: Secondary | ICD-10-CM

## 2023-12-26 DIAGNOSIS — Z87891 Personal history of nicotine dependence: Secondary | ICD-10-CM

## 2023-12-26 DIAGNOSIS — Z96651 Presence of right artificial knee joint: Secondary | ICD-10-CM | POA: Diagnosis not present

## 2023-12-26 DIAGNOSIS — Z96652 Presence of left artificial knee joint: Secondary | ICD-10-CM | POA: Insufficient documentation

## 2023-12-26 DIAGNOSIS — E119 Type 2 diabetes mellitus without complications: Secondary | ICD-10-CM | POA: Diagnosis not present

## 2023-12-26 DIAGNOSIS — G8918 Other acute postprocedural pain: Secondary | ICD-10-CM | POA: Diagnosis not present

## 2023-12-26 DIAGNOSIS — R609 Edema, unspecified: Secondary | ICD-10-CM | POA: Diagnosis not present

## 2023-12-26 DIAGNOSIS — I10 Essential (primary) hypertension: Secondary | ICD-10-CM

## 2023-12-26 DIAGNOSIS — Z471 Aftercare following joint replacement surgery: Secondary | ICD-10-CM | POA: Diagnosis not present

## 2023-12-26 HISTORY — PX: TOTAL KNEE ARTHROPLASTY: SHX125

## 2023-12-26 LAB — GLUCOSE, CAPILLARY
Glucose-Capillary: 96 mg/dL (ref 70–99)
Glucose-Capillary: 89 mg/dL (ref 70–99)
Glucose-Capillary: 107 mg/dL — ABNORMAL HIGH (ref 70–99)
Glucose-Capillary: 112 mg/dL — ABNORMAL HIGH (ref 70–99)

## 2023-12-26 SURGERY — ARTHROPLASTY, KNEE, TOTAL
Anesthesia: Regional | Site: Knee | Laterality: Right

## 2023-12-26 MED ORDER — CHLORHEXIDINE GLUCONATE 0.12 % MT SOLN
15.0000 mL | Freq: Once | OROMUCOSAL | Status: AC
  Administered 2023-12-26: 15 mL via OROMUCOSAL

## 2023-12-26 MED ORDER — ONDANSETRON HCL 4 MG/2ML IJ SOLN
4.0000 mg | Freq: Four times a day (QID) | INTRAMUSCULAR | Status: DC | PRN
  Administered 2023-12-27: 4 mg via INTRAVENOUS
  Filled 2023-12-26: qty 2

## 2023-12-26 MED ORDER — METOCLOPRAMIDE HCL 5 MG/ML IJ SOLN: 5.0000 mg | Freq: Three times a day (TID) | INTRAMUSCULAR | Status: DC | PRN

## 2023-12-26 MED ORDER — ACETAMINOPHEN 500 MG PO TABS
1000.0000 mg | ORAL_TABLET | Freq: Four times a day (QID) | ORAL | Status: AC
  Administered 2023-12-26 – 2023-12-27 (×4): 1000 mg via ORAL
  Filled 2023-12-26 (×4): qty 2

## 2023-12-26 MED ORDER — BUPIVACAINE-EPINEPHRINE 0.25% -1:200000 IJ SOLN
INTRAMUSCULAR | Status: DC | PRN
  Administered 2023-12-26: 20 mL

## 2023-12-26 MED ORDER — GLYCOPYRROLATE 0.2 MG/ML IJ SOLN
INTRAMUSCULAR | Status: AC
  Filled 2023-12-26: qty 1

## 2023-12-26 MED ORDER — PHENOL 1.4 % MT LIQD: 1.0000 | OROMUCOSAL | Status: DC | PRN

## 2023-12-26 MED ORDER — PHENYLEPHRINE 80 MCG/ML (10ML) SYRINGE FOR IV PUSH (FOR BLOOD PRESSURE SUPPORT)
PREFILLED_SYRINGE | INTRAVENOUS | Status: AC
  Filled 2023-12-26: qty 10

## 2023-12-26 MED ORDER — SODIUM CHLORIDE (PF) 0.9 % IJ SOLN
INTRAMUSCULAR | Status: DC | PRN
  Administered 2023-12-26: 60 mL

## 2023-12-26 MED ORDER — METOCLOPRAMIDE HCL 5 MG PO TABS: 5.0000 mg | ORAL_TABLET | Freq: Three times a day (TID) | ORAL | Status: DC | PRN

## 2023-12-26 MED ORDER — CLONIDINE HCL (ANALGESIA) 100 MCG/ML EP SOLN
EPIDURAL | Status: DC | PRN
  Administered 2023-12-26: 100 ug

## 2023-12-26 MED ORDER — MAGNESIUM CITRATE PO SOLN: 1.0000 | Freq: Once | ORAL | Status: DC | PRN

## 2023-12-26 MED ORDER — DOCUSATE SODIUM 100 MG PO CAPS
100.0000 mg | ORAL_CAPSULE | Freq: Two times a day (BID) | ORAL | Status: DC
  Administered 2023-12-26 – 2023-12-28 (×4): 100 mg via ORAL
  Filled 2023-12-26 (×4): qty 1

## 2023-12-26 MED ORDER — METHOCARBAMOL 500 MG PO TABS
500.0000 mg | ORAL_TABLET | Freq: Four times a day (QID) | ORAL | Status: DC | PRN
  Administered 2023-12-28: 500 mg via ORAL
  Filled 2023-12-26: qty 1

## 2023-12-26 MED ORDER — SODIUM CHLORIDE 0.9 % IR SOLN
Status: DC | PRN
  Administered 2023-12-26: 1000 mL

## 2023-12-26 MED ORDER — MIDAZOLAM HCL 2 MG/2ML IJ SOLN
1.0000 mg | Freq: Once | INTRAMUSCULAR | Status: AC
  Administered 2023-12-26: 1 mg via INTRAVENOUS
  Filled 2023-12-26: qty 2

## 2023-12-26 MED ORDER — CEFAZOLIN SODIUM-DEXTROSE 2-4 GM/100ML-% IV SOLN
2.0000 g | Freq: Four times a day (QID) | INTRAVENOUS | Status: AC
  Administered 2023-12-26 (×2): 2 g via INTRAVENOUS
  Filled 2023-12-26 (×2): qty 100

## 2023-12-26 MED ORDER — TRANEXAMIC ACID-NACL 1000-0.7 MG/100ML-% IV SOLN
1000.0000 mg | INTRAVENOUS | Status: AC
Start: 2023-12-26 — End: 2023-12-26
  Administered 2023-12-26: 1000 mg via INTRAVENOUS
  Filled 2023-12-26: qty 100

## 2023-12-26 MED ORDER — STERILE WATER FOR IRRIGATION IR SOLN
Status: DC | PRN
Start: 2023-12-26 — End: 2023-12-26
  Administered 2023-12-26: 1000 mL

## 2023-12-26 MED ORDER — CEFAZOLIN SODIUM-DEXTROSE 2-4 GM/100ML-% IV SOLN
2.0000 g | INTRAVENOUS | Status: AC
  Administered 2023-12-26: 2 g via INTRAVENOUS
  Filled 2023-12-26: qty 100

## 2023-12-26 MED ORDER — NALOXEGOL OXALATE 25 MG PO TABS: 25.0000 mg | ORAL_TABLET | Freq: Every day | ORAL | Status: DC | PRN

## 2023-12-26 MED ORDER — BUPIVACAINE LIPOSOME 1.3 % IJ SUSP
INTRAMUSCULAR | Status: AC
  Filled 2023-12-26: qty 20

## 2023-12-26 MED ORDER — LACTATED RINGERS IV SOLN: INTRAVENOUS | Status: DC

## 2023-12-26 MED ORDER — PROPOFOL 1000 MG/100ML IV EMUL
INTRAVENOUS | Status: AC
  Filled 2023-12-26: qty 100

## 2023-12-26 MED ORDER — RISAQUAD PO CAPS
1.0000 | ORAL_CAPSULE | Freq: Every day | ORAL | Status: DC
  Administered 2023-12-27 – 2023-12-28 (×2): 1 via ORAL
  Filled 2023-12-26 (×2): qty 1

## 2023-12-26 MED ORDER — GLYCOPYRROLATE 0.2 MG/ML IJ SOLN
INTRAMUSCULAR | Status: DC | PRN
Start: 2023-12-26 — End: 2023-12-26
  Administered 2023-12-26: .2 mg via INTRAVENOUS

## 2023-12-26 MED ORDER — FENTANYL CITRATE (PF) 100 MCG/2ML IJ SOLN
INTRAMUSCULAR | Status: AC
  Filled 2023-12-26: qty 2

## 2023-12-26 MED ORDER — PROPRANOLOL HCL 20 MG PO TABS: 10.0000 mg | ORAL_TABLET | Freq: Two times a day (BID) | ORAL | Status: DC | PRN

## 2023-12-26 MED ORDER — FENTANYL CITRATE PF 50 MCG/ML IJ SOSY
50.0000 ug | PREFILLED_SYRINGE | Freq: Once | INTRAMUSCULAR | Status: AC
Start: 2023-12-26 — End: 2023-12-26
  Administered 2023-12-26: 50 ug via INTRAVENOUS
  Filled 2023-12-26: qty 2

## 2023-12-26 MED ORDER — PHENYLEPHRINE HCL-NACL 20-0.9 MG/250ML-% IV SOLN
INTRAVENOUS | Status: DC | PRN
  Administered 2023-12-26: 25 ug/min via INTRAVENOUS

## 2023-12-26 MED ORDER — METHOCARBAMOL 1000 MG/10ML IJ SOLN: 500.0000 mg | Freq: Four times a day (QID) | INTRAMUSCULAR | Status: DC | PRN

## 2023-12-26 MED ORDER — ROPIVACAINE HCL 5 MG/ML IJ SOLN
INTRAMUSCULAR | Status: DC | PRN
Start: 2023-12-26 — End: 2023-12-26
  Administered 2023-12-26: 30 mL via PERINEURAL

## 2023-12-26 MED ORDER — DIPHENHYDRAMINE HCL 12.5 MG/5ML PO ELIX: 12.5000 mg | ORAL_SOLUTION | ORAL | Status: DC | PRN

## 2023-12-26 MED ORDER — OXYCODONE HCL 5 MG PO TABS
5.0000 mg | ORAL_TABLET | ORAL | Status: DC | PRN
  Administered 2023-12-26: 10 mg via ORAL
  Administered 2023-12-26 – 2023-12-27 (×2): 5 mg via ORAL
  Administered 2023-12-27 (×4): 10 mg via ORAL
  Administered 2023-12-28 (×2): 5 mg via ORAL
  Administered 2023-12-28: 10 mg via ORAL
  Filled 2023-12-26: qty 2
  Filled 2023-12-26 (×2): qty 1
  Filled 2023-12-26: qty 2
  Filled 2023-12-26: qty 1
  Filled 2023-12-26 (×3): qty 2
  Filled 2023-12-26: qty 1

## 2023-12-26 MED ORDER — FENTANYL CITRATE (PF) 100 MCG/2ML IJ SOLN
INTRAMUSCULAR | Status: DC | PRN
  Administered 2023-12-26: 50 ug via INTRAVENOUS

## 2023-12-26 MED ORDER — PROPOFOL 500 MG/50ML IV EMUL
INTRAVENOUS | Status: AC
  Filled 2023-12-26: qty 50

## 2023-12-26 MED ORDER — BISACODYL 5 MG PO TBEC: 5.0000 mg | DELAYED_RELEASE_TABLET | Freq: Every day | ORAL | Status: DC | PRN

## 2023-12-26 MED ORDER — HYDROMORPHONE HCL 1 MG/ML IJ SOLN: 0.5000 mg | INTRAMUSCULAR | Status: DC | PRN

## 2023-12-26 MED ORDER — PROPOFOL 500 MG/50ML IV EMUL
INTRAVENOUS | Status: DC | PRN
  Administered 2023-12-26: 80 ug/kg/min via INTRAVENOUS

## 2023-12-26 MED ORDER — LORATADINE 10 MG PO TABS
10.0000 mg | ORAL_TABLET | Freq: Every day | ORAL | Status: DC
  Administered 2023-12-27 – 2023-12-28 (×2): 10 mg via ORAL
  Filled 2023-12-26 (×2): qty 1

## 2023-12-26 MED ORDER — SODIUM CHLORIDE (PF) 0.9 % IJ SOLN
INTRAMUSCULAR | Status: AC
  Filled 2023-12-26: qty 40

## 2023-12-26 MED ORDER — CEFAZOLIN SODIUM-DEXTROSE 2-3 GM-%(50ML) IV SOLR: INTRAVENOUS | Status: DC | PRN

## 2023-12-26 MED ORDER — ACETAMINOPHEN 10 MG/ML IV SOLN: 1000.0000 mg | Freq: Once | INTRAVENOUS | Status: DC | PRN

## 2023-12-26 MED ORDER — POTASSIUM CHLORIDE IN NACL 20-0.9 MEQ/L-% IV SOLN
INTRAVENOUS | Status: AC
  Filled 2023-12-26: qty 1000

## 2023-12-26 MED ORDER — 0.9 % SODIUM CHLORIDE (POUR BTL) OPTIME
TOPICAL | Status: DC | PRN
  Administered 2023-12-26: 1000 mL

## 2023-12-26 MED ORDER — BUPIVACAINE-EPINEPHRINE (PF) 0.25% -1:200000 IJ SOLN
INTRAMUSCULAR | Status: AC
  Filled 2023-12-26: qty 30

## 2023-12-26 MED ORDER — ONDANSETRON HCL 4 MG/2ML IJ SOLN: 4.0000 mg | Freq: Once | INTRAMUSCULAR | Status: DC | PRN

## 2023-12-26 MED ORDER — ASPIRIN 81 MG PO CHEW
81.0000 mg | CHEWABLE_TABLET | Freq: Two times a day (BID) | ORAL | Status: DC
  Administered 2023-12-27 – 2023-12-28 (×3): 81 mg via ORAL
  Filled 2023-12-26 (×3): qty 1

## 2023-12-26 MED ORDER — FENTANYL CITRATE PF 50 MCG/ML IJ SOSY: 25.0000 ug | PREFILLED_SYRINGE | INTRAMUSCULAR | Status: DC | PRN

## 2023-12-26 MED ORDER — OXYCODONE HCL 5 MG PO TABS
10.0000 mg | ORAL_TABLET | ORAL | Status: DC | PRN
  Filled 2023-12-26: qty 2

## 2023-12-26 MED ORDER — POLYETHYLENE GLYCOL 3350 17 G PO PACK: 17.0000 g | PACK | Freq: Every day | ORAL | Status: DC | PRN

## 2023-12-26 MED ORDER — ACETAMINOPHEN 10 MG/ML IV SOLN
1000.0000 mg | INTRAVENOUS | Status: AC
  Administered 2023-12-26: 1000 mg via INTRAVENOUS
  Filled 2023-12-26: qty 100

## 2023-12-26 MED ORDER — MIDAZOLAM HCL 2 MG/2ML IJ SOLN
INTRAMUSCULAR | Status: AC
  Filled 2023-12-26: qty 2

## 2023-12-26 MED ORDER — ALUM & MAG HYDROXIDE-SIMETH 200-200-20 MG/5ML PO SUSP: 30.0000 mL | ORAL | Status: DC | PRN

## 2023-12-26 MED ORDER — ONDANSETRON HCL 4 MG PO TABS: 4.0000 mg | ORAL_TABLET | Freq: Four times a day (QID) | ORAL | Status: DC | PRN

## 2023-12-26 MED ORDER — ORAL CARE MOUTH RINSE: 15.0000 mL | Freq: Once | OROMUCOSAL | Status: AC

## 2023-12-26 MED ORDER — MENTHOL 3 MG MT LOZG: 1.0000 | LOZENGE | OROMUCOSAL | Status: DC | PRN

## 2023-12-26 MED ORDER — INSULIN ASPART 100 UNIT/ML IJ SOLN
0.0000 [IU] | Freq: Three times a day (TID) | INTRAMUSCULAR | Status: DC
  Administered 2023-12-27: 2 [IU] via SUBCUTANEOUS

## 2023-12-26 MED ORDER — BUPIVACAINE IN DEXTROSE 0.75-8.25 % IT SOLN
INTRATHECAL | Status: DC | PRN
  Administered 2023-12-26: 13.5 mg via INTRATHECAL

## 2023-12-26 SURGICAL SUPPLY — 63 items
ATTUNE PS FEM RT SZ 7 CEM KNEE (Femur) IMPLANT
ATTUNE PSRP INSR SZ7 6 KNEE (Insert) IMPLANT
BAG COUNTER SPONGE SURGICOUNT (BAG) IMPLANT
BAG DECANTER FOR FLEXI CONT (MISCELLANEOUS) ×1 IMPLANT
BAG ZIPLOCK 12X15 (MISCELLANEOUS) IMPLANT
BASE TIBIAL ROT PLAT SZ 7 KNEE (Knees) IMPLANT
BLADE SAW SGTL 11.0X1.19X90.0M (BLADE) ×1 IMPLANT
BLADE SAW SGTL 13.0X1.19X90.0M (BLADE) ×1 IMPLANT
BLADE SURG SZ10 CARB STEEL (BLADE) ×2 IMPLANT
BNDG ELASTIC 4INX 5YD STR LF (GAUZE/BANDAGES/DRESSINGS) ×1 IMPLANT
BNDG ELASTIC 6INX 5YD STR LF (GAUZE/BANDAGES/DRESSINGS) ×1 IMPLANT
BOWL SMART MIX CTS (DISPOSABLE) ×1 IMPLANT
CEMENT HV SMART SET (Cement) ×2 IMPLANT
COVER SURGICAL LIGHT HANDLE (MISCELLANEOUS) ×1 IMPLANT
CUFF TRNQT CYL 34X4.125X (TOURNIQUET CUFF) ×1 IMPLANT
DRAPE INCISE IOBAN 66X45 STRL (DRAPES) IMPLANT
DRAPE SHEET LG 3/4 BI-LAMINATE (DRAPES) ×1 IMPLANT
DRAPE SURG ORHT 6 SPLT 77X108 (DRAPES) ×2 IMPLANT
DRAPE TOP 10253 STERILE (DRAPES) ×1 IMPLANT
DRAPE U-SHAPE 47X51 STRL (DRAPES) ×1 IMPLANT
DRSG AQUACEL AG ADV 3.5X10 (GAUZE/BANDAGES/DRESSINGS) ×1 IMPLANT
DRSG TEGADERM 4X4.75 (GAUZE/BANDAGES/DRESSINGS) IMPLANT
DURAPREP 26ML APPLICATOR (WOUND CARE) ×1 IMPLANT
ELECT BLADE TIP CTD 4 INCH (ELECTRODE) ×1 IMPLANT
ELECT REM PT RETURN 15FT ADLT (MISCELLANEOUS) ×1 IMPLANT
EVACUATOR 1/8 PVC DRAIN (DRAIN) IMPLANT
GAUZE SPONGE 2X2 8PLY STRL LF (GAUZE/BANDAGES/DRESSINGS) IMPLANT
GLOVE BIO SURGEON STRL SZ7 (GLOVE) ×1 IMPLANT
GLOVE BIOGEL PI IND STRL 7.0 (GLOVE) ×1 IMPLANT
GLOVE BIOGEL PI IND STRL 8 (GLOVE) ×1 IMPLANT
GLOVE SURG SS PI 8.0 STRL IVOR (GLOVE) ×1 IMPLANT
GOWN STRL REUS W/ TWL XL LVL3 (GOWN DISPOSABLE) ×2 IMPLANT
HEMOSTAT SPONGE AVITENE ULTRA (HEMOSTASIS) IMPLANT
HOLDER FOLEY CATH W/STRAP (MISCELLANEOUS) IMPLANT
IMMOBILIZER KNEE 20 (SOFTGOODS) ×1 IMPLANT
IMMOBILIZER KNEE 20 THIGH 36 (SOFTGOODS) ×1 IMPLANT
KIT TURNOVER KIT A (KITS) IMPLANT
MANIFOLD NEPTUNE II (INSTRUMENTS) ×1 IMPLANT
NS IRRIG 1000ML POUR BTL (IV SOLUTION) IMPLANT
PACK TOTAL KNEE CUSTOM (KITS) ×1 IMPLANT
PATELLA MEDIAL ATTUN 35MM KNEE (Knees) IMPLANT
PIN STEINMAN FIXATION KNEE (PIN) IMPLANT
PROTECTOR NERVE ULNAR (MISCELLANEOUS) ×1 IMPLANT
SAW OSC TIP CART 19.5X105X1.3 (SAW) IMPLANT
SEALER BIPOLAR AQUA 6.0 (INSTRUMENTS) IMPLANT
SET HNDPC FAN SPRY TIP SCT (DISPOSABLE) ×1 IMPLANT
SOLUTION PRONTOSAN WOUND 350ML (IRRIGATION / IRRIGATOR) ×1 IMPLANT
SPIKE FLUID TRANSFER (MISCELLANEOUS) ×1 IMPLANT
STAPLER VISISTAT (STAPLE) IMPLANT
STRIP CLOSURE SKIN 1/2X4 (GAUZE/BANDAGES/DRESSINGS) IMPLANT
SUT BONE WAX W31G (SUTURE) ×1 IMPLANT
SUT MNCRL AB 4-0 PS2 18 (SUTURE) IMPLANT
SUT STRATAFIX 0 PDS 27 VIOLET (SUTURE) ×1 IMPLANT
SUT VIC AB 1 CT1 27XBRD ANTBC (SUTURE) ×3 IMPLANT
SUT VIC AB 2-0 CT1 TAPERPNT 27 (SUTURE) ×3 IMPLANT
SUTURE STRATFX 0 PDS 27 VIOLET (SUTURE) ×1 IMPLANT
SYR 3ML LL SCALE MARK (SYRINGE) IMPLANT
TIBIAL BASE ROT PLAT SZ 7 KNEE (Knees) ×1 IMPLANT
TRAY FOLEY MTR SLVR 16FR STAT (SET/KITS/TRAYS/PACK) ×1 IMPLANT
TUBE SUCTION HIGH CAP CLEAR NV (SUCTIONS) ×1 IMPLANT
WATER STERILE IRR 1000ML POUR (IV SOLUTION) ×1 IMPLANT
WIPE CHG 2% PREP (PERSONAL CARE ITEMS) ×1 IMPLANT
WRAP KNEE MAXI GEL POST OP (GAUZE/BANDAGES/DRESSINGS) ×1 IMPLANT

## 2023-12-26 NOTE — Plan of Care (Signed)
  Problem: Education: Goal: Knowledge of General Education information will improve Description: Including pain rating scale, medication(s)/side effects and non-pharmacologic comfort measures Outcome: Progressing   Problem: Activity: Goal: Risk for activity intolerance will decrease Outcome: Progressing   Problem: Nutrition: Goal: Adequate nutrition will be maintained Outcome: Progressing   Problem: Pain Managment: Goal: General experience of comfort will improve and/or be controlled Outcome: Progressing   Problem: Skin Integrity: Goal: Risk for impaired skin integrity will decrease Outcome: Progressing

## 2023-12-26 NOTE — Anesthesia Procedure Notes (Signed)
 Spinal  Patient location during procedure: OR Start time: 12/26/2023 12:00 PM End time: 12/26/2023 12:04 PM Reason for block: surgical anesthesia Staffing Performed: anesthesiologist  Anesthesiologist: Trevor Iha, MD Performed by: Trevor Iha, MD Authorized by: Trevor Iha, MD   Preanesthetic Checklist Completed: patient identified, IV checked, risks and benefits discussed, surgical consent, monitors and equipment checked, pre-op evaluation and timeout performed Spinal Block Patient position: sitting Prep: DuraPrep and site prepped and draped Patient monitoring: heart rate, cardiac monitor, continuous pulse ox and blood pressure Approach: midline Location: L3-4 Injection technique: single-shot Needle Needle type: Pencan  Needle gauge: 24 G Needle length: 10 cm Assessment Sensory level: T4 Events: CSF return Additional Notes  1 Attempt (s). Pt tolerated procedure well.

## 2023-12-26 NOTE — Interval H&P Note (Signed)
 History and Physical Interval Note:  12/26/2023 11:21 AM  Jeffrey Banks  has presented today for surgery, with the diagnosis of Right knee degenerative joint disease.  The various methods of treatment have been discussed with the patient and family. After consideration of risks, benefits and other options for treatment, the patient has consented to  Procedure(s): ARTHROPLASTY, KNEE, TOTAL (Right) as a surgical intervention.  The patient's history has been reviewed, patient examined, no change in status, stable for surgery.  I have reviewed the patient's chart and labs.  Questions were answered to the patient's satisfaction.     Javier Docker

## 2023-12-26 NOTE — Care Plan (Signed)
 Ortho Bundle Case Management Note  Patient Details  Name: Jeffrey Banks MRN: [id number] Date of Birth: 04-Sep-1951  RT TKA 12/26/23    DCP: home with wife DME: RW, ordered through [facility] PT: EO [place] 3/17                  DME Arranged:  Dan Humphreys rolling DME Agency:  [employer]  HH Arranged:  NA HH Agency:     Additional Comments: Please contact me with any questions of if this plan should need to change.  Aida Raider, Case Manager [facility] [phone]   12/26/2023, 10:53 AM

## 2023-12-26 NOTE — Brief Op Note (Signed)
 12/26/2023  11:23 AM  PATIENT:  Jeffrey Banks  73 y.o. male  PRE-OPERATIVE DIAGNOSIS:  Right knee degenerative joint disease  POST-OPERATIVE DIAGNOSIS:  * No post-op diagnosis entered *  PROCEDURE:  Procedure(s): ARTHROPLASTY, KNEE, TOTAL (Right)  SURGEON:  Surgeons and Role:    Jene Every, MD - Primary  PHYSICIAN ASSISTANT:  The aquamantis was utilized for this case to help facilitate better hemostasis as patient was felt to be at increased risk of bleeding because of recent anticoagulation use.   ASSISTANTS: Bissell   ANESTHESIA:   spinal  EBL:  50   BLOOD ADMINISTERED:none  DRAINS: none   LOCAL MEDICATIONS USED:  MARCAINE     SPECIMEN:  No Specimen  DISPOSITION OF SPECIMEN:  N/A  COUNTS:  YES  TOURNIQUET:  65 min  DICTATION: .Other Dictation: Dictation Number   [id number]  PLAN OF CARE: Admit for overnight observation  PATIENT DISPOSITION:  PACU - hemodynamically stable.   Delay start of Pharmacological VTE agent (>24hrs) due to surgical blood loss or risk of bleeding: no

## 2023-12-26 NOTE — Op Note (Unsigned)
 [name], [name] MEDICAL RECORD NO: [id number] ACCOUNT NO: [id number] DATE OF BIRTH: [birth year] FACILITY: [facility] LOCATION: [id number] PHYSICIAN: Javier Docker, MD  Operative Report   DATE OF PROCEDURE: 12/26/2023  PREOPERATIVE DIAGNOSES:  End-stage osteoarthrosis, varus deformity of the right knee.  POSTOPERATIVE DIAGNOSES:  End-stage osteoarthrosis, varus deformity of the right knee.  PROCEDURE PERFORMED:  Right total knee arthroplasty utilizing DePuy Attune rotating platform.  ANESTHESIA:  Spinal.  IMPLANTS:  We used a 7 femur, 7 tibia, 6 mm insert, 35 patella.  ASSISTANT:   Jeffrey Penman, PA.  HISTORY:  This is 73 year old with end-stage osteoarthrosis, varus deformity, bone-on-bone right knee, indicated for replacement of degenerated joint.  Risks and benefits discussed including bleeding, infection, damage to neurovascular structures, no  change in symptoms, worsening symptoms, DVT, PE, anesthetic complications, etc.    TECHNIQUE:  The patient was placed in the supine position after induction of adequate spinal anesthesia, 2 grams Kefzol, the right lower extremity was prepped and draped and exsanguinated in the usual sterile fashion.  Thigh tourniquet inflated to 250  mmHg.  Midline incision was then made over the knee.  Full-thickness flaps developed.  Median parapatellar arthrotomy was performed.  There were significant adhesions adhering the infrapatellar fat pad.  This was debrided and released.  The patella was  everted with an inside-out lateral release to facilitate eversion.  Elevated soft tissues medially preserving the MCL.  Knee was flexed.  Tricompartmental osteoarthrosis, bone-on-bone was noted.  Ganglion cysts were expressed from beneath the patellar  ligament and the MCL.  Leksell rongeur utilized to remove the remnants of medial and lateral menisci in the ACL and to fashion a notch above the femoral notch as a starting hole from the femoral drill, which was drilled  in line with the femur, was  irrigated, T-handle intramedullary guide, 5 degree right, 9 off the distal femur.  This was selected, pinned to perform a distal femoral cut.  I was then able to sublux the tibia due to the tightness of the knee.  I sized the femur off the anterior  cortex to be a 7 and 3 degrees of external rotation.  This was pinned, performed a distal femoral block. Performed our anterior, posterior and chamfer cut.  Soft tissue was protected posteriorly at all times.  Subluxed the tibia, posteromedially was  eburnated bone on the low side. Used external alignment guide throughout the defect parallel to the shaft, 3-degree slope, bisecting the tibiotalar joint, pinned and performed our cut.  The extension block with a 6 was symmetric and I was able to achieve  full extension.  I then reflexed the knee.  Attention turned back to the tibia, sized to a 7, maximizing coverage just to medial aspect of the tibial tubercle.  This was then pinned.  Harvested the bone centrally and impacted into the distal femur.   Drilled centrally punch guide.  I turned our attention back to the femur.  Box cut bisecting the femur.  Box cut performed.  Placed a trial femur.  It sat flush, drilled the lug holes.  Placed a 6 mm insert, reduced the knee.  I had full extension and  full flexion and good stability to varus and valgus stress at 0 and 30 degrees.  Negative anterior drawer.  Measured the patella to 24, planned to a 15 utilizing patellar jig.  This was sized to a 35.  With a paddle parallel to the joint surface,  drilling our peg holes, placing a trial  of patella, reducing it, and had excellent patellofemoral tracking.  All instrumentation was then removed and checked posteriorly.  Remnants of the menisci excised, cauterized the geniculates, popliteal and capsule  was intact.  Two loose bodies were removed.  A pulsatile lavage was used to clean all bony surfaces.  Flexed the knee.  All surfaces thoroughly  dried.  Mixed cement on the back table in a vacuum.  I then placed cement in the proximal tibia after  thoroughly drying it and drilling holes in the medial tibial plateau.  Due to the eburnated bone digitally pressurizing the cement.  Cement was placed on the trial, on the permanent insert and impacted into place.  Redundant cement removed.  I cemented  the femur, femoral component, impacted, redundant cement removed.  6 mm trial insert was placed, reduced to full extension, held the axial load full extension throughout the curing of the cement.  Redundant cement removed.  I cemented and clamped the  patella as well.  Marcaine as well as Prontosan was placed in the wound.  Just prior to the implantation, we used Exparel through the posterior capsule, aspirating without breaking the vacuum, anesthetizing the menisci, quadriceps, proximal tibia,  periosteum of the femur, etc.  Tourniquet was deflated at 65 minutes.  Any bleeding was cauterized with the Aquamantys, which was used.  Next, I meticulously removed all redundant cement.  Removed the trial, copiously irrigated with pulsatile lavage and  Prontosan.  Placed a 6 permanent insert, reduced it, had full extension, full flexion, good stability of varus and valgus stress at 0 or 30 degrees,  negative anterior drawer.  I then in mid flexion reapproximated patellar arthrotomy with 1 Vicryl  interrupted figure-of-eight sutures oversewn with running Stratafix.  I performed a lateral release preserving the capsule and the synovium.  I had excellent patellofemoral tracking.  I copiously irrigated the subcutaneous tissue with Prontosan.   Subcutaneous with 2-0 and skin with staples.  Wound was dressed thoroughly, placed immobilizer and transported to the recovery room in satisfactory condition.   The patient tolerated the procedure well.  No complications.  Assistant Jeffrey Banks, [place] was used throughout the case in patient positioning, exposure, and  closure.  BLOOD LOSS:  50 mL.   VAI D: 12/26/2023 2:15:03 pm T: 12/26/2023 10:42:00 pm  JOB: [id number]/ [id number]

## 2023-12-26 NOTE — Anesthesia Procedure Notes (Signed)
 Procedure Name: MAC Date/Time: 12/26/2023 12:16 PM  Performed by: Floydene Flock, CRNAPre-anesthesia Checklist: Patient identified, Emergency Drugs available, Suction available and Patient being monitored Patient Re-evaluated:Patient Re-evaluated prior to induction Oxygen Delivery Method: Simple face mask Placement Confirmation: positive ETCO2

## 2023-12-26 NOTE — Anesthesia Postprocedure Evaluation (Signed)
 Anesthesia Post Note  Patient: LONN IM  Procedure(s) Performed: ARTHROPLASTY, KNEE, TOTAL (Right: Knee)     Patient location during evaluation: Nursing Unit Anesthesia Type: Regional and Spinal Level of consciousness: oriented and awake and alert Pain management: pain level controlled Vital Signs Assessment: post-procedure vital signs reviewed and stable Respiratory status: spontaneous breathing and respiratory function stable Cardiovascular status: blood pressure returned to baseline and stable Postop Assessment: no headache, no backache, no apparent nausea or vomiting and patient able to bend at knees Anesthetic complications: no  No notable events documented.  Last Vitals:  Vitals:   12/26/23 1530 12/26/23 1554  BP: 128/84 (!) 154/58  Pulse: (!) 52 (!) 44  Resp: 16 20  Temp: (!) 36 C (!) 36.4 C  SpO2: 98% 100%    Last Pain:  Vitals:   12/26/23 1600  TempSrc:   PainSc: 0-No pain                 Trevor Iha

## 2023-12-26 NOTE — Progress Notes (Addendum)
   12/26/23 2227  BiPAP/CPAP/SIPAP  BiPAP/CPAP/SIPAP Pt Type Adult (pt found already on home cpap)  BiPAP/CPAP/SIPAP Resmed  Dentures removed? Not applicable  Respiratory Rate 16 breaths/min  EPAP 12 cmH2O  FiO2 (%) 21 %  Heater Temperature  (sterile water provided to pt/family as requested)  Patient Home Equipment Yes (machine plugged into red outlet)  Auto Titrate No  Safety Check Completed by RT for Home Unit Yes, no issues noted   HR51, RR16, SPO2 98%

## 2023-12-26 NOTE — Evaluation (Addendum)
 Physical Therapy Evaluation Patient Details Name: KINSLER SOEDER MRN: [id number] DOB: 1951/01/09 Today's Date: 12/26/2023  History of Present Illness  73 yo male presents to therapy s/p R TKA on 12/26/2023 due to failure of conservative measures. Pt PMH includes but is not limited to: arthritis, LBP, depression, DM II, HLD, cervical spine surgery, vertigo, OSA on CPAP, L TKA (2016).  Clinical Impression    TARIN NAVAREZ is a 73 y.o. male POD 0 s/p R TKA. Patient reports Mod I with mobility at baseline. Patient is now limited by functional impairments (see PT problem list below) and requires min A for supine to sit and mod A for sit to supine for bed mobility and pt unable to safely perform transfers or gait tasks at time of eval due to pain report, vertigo like symptoms, nausea and abn sensation buttock and groin region. Patient instructed in exercise to facilitate ROM and circulation to manage edema. . Patient will benefit from continued skilled PT interventions to address impairments and progress towards PLOF. Acute PT will follow to progress mobility and stair training in preparation for safe discharge home with family support and OPPT services scheduled for 3/17.         If plan is discharge home, recommend the following: A little help with walking and/or transfers;A little help with bathing/dressing/bathroom;Assistance with cooking/housework;Assist for transportation;Help with stairs or ramp for entrance   Can travel by private vehicle        Equipment Recommendations Rolling walker (2 wheels)  Recommendations for Other Services       Functional Status Assessment Patient has had a recent decline in their functional status and demonstrates the ability to make significant improvements in function in a reasonable and predictable amount of time.     Precautions / Restrictions Precautions Precautions: Knee;Fall Restrictions Weight Bearing Restrictions Per Provider Order: No       Mobility  Bed Mobility Overal bed mobility: Needs Assistance Bed Mobility: Supine to Sit, Sit to Supine     Supine to sit: Min assist, HOB elevated, Used rails Sit to supine: Mod assist, Used rails   General bed mobility comments: pt limited due to reports of vertigo like symptoms with rolling, lateral weight shfiting in sitting, pt required cues and HOB elevated, bed rails with min A for trunk for supine to sit, to return to bed PT assisted pt to "long sit" postion and elevated HOB to limit rolling    Transfers                   General transfer comment: NT due to reprots of room spining, nausea and  abn sensation buttock and groin region    Ambulation/Gait               General Gait Details: NT  Stairs            Wheelchair Mobility     Tilt Bed    Modified Rankin (Stroke Patients Only)       Balance Overall balance assessment: Needs assistance Sitting-balance support: Feet supported Sitting balance-Leahy Scale: Fair                                       Pertinent Vitals/Pain Pain Assessment Pain Assessment: 0-10 Pain Score: 6  Pain Location: R LE and leg Pain Descriptors / Indicators: Aching, Constant, Operative site guarding, Discomfort, Throbbing Pain Intervention(s): Limited activity  within patient's tolerance, Monitored during session, Premedicated before session, Repositioned, Ice applied    Home Living Family/patient expects to be discharged to:: Private residence Living Arrangements: Spouse/significant other Available Help at Discharge: Family Type of Home: House Home Access: Stairs to enter Entrance Stairs-Rails: None Entrance Stairs-Number of Steps: 3 (B pull bars)   Home Layout: One level Home Equipment: Cane - single point      Prior Function Prior Level of Function : Independent/Modified Independent;Driving             Mobility Comments: mod I with SPC for all ADLs and self care tasks        Extremity/Trunk Assessment        Lower Extremity Assessment Lower Extremity Assessment: RLE deficits/detail RLE Deficits / Details: ankle DF/PF 5/5; SLR < 10 degree lag RLE Sensation: decreased light touch;decreased proprioception (buttock and groin)    Cervical / Trunk Assessment Cervical / Trunk Assessment: Neck Surgery  Communication   Communication Communication: No apparent difficulties    Cognition Arousal: Alert Behavior During Therapy: WFL for tasks assessed/performed   PT - Cognitive impairments: No apparent impairments                         Following commands: Intact       Cueing       General Comments      Exercises Total Joint Exercises Ankle Circles/Pumps: AROM, Both, 15 reps   Assessment/Plan    PT Assessment Patient needs continued PT services  PT Problem List Decreased strength;Decreased range of motion;Decreased activity tolerance;Decreased mobility;Decreased balance;Decreased coordination;Pain       PT Treatment Interventions DME instruction;Gait training;Stair training;Functional mobility training;Therapeutic activities;Therapeutic exercise;Balance training;Neuromuscular re-education;Patient/family education;Modalities    PT Goals (Current goals can be found in the Care Plan section)  Acute Rehab PT Goals Patient Stated Goal: to be able to walk longer distances/more steps and hunt PT Goal Formulation: With patient Time For Goal Achievement: 01/09/24 Potential to Achieve Goals: Good    Frequency 7X/week     Co-evaluation               AM-PAC PT "6 Clicks" Mobility  Outcome Measure Help needed turning from your back to your side while in a flat bed without using bedrails?: A Little Help needed moving from lying on your back to sitting on the side of a flat bed without using bedrails?: A Little Help needed moving to and from a bed to a chair (including a wheelchair)?: Total Help needed standing up from a chair  using your arms (e.g., wheelchair or bedside chair)?: Total Help needed to walk in hospital room?: Total Help needed climbing 3-5 steps with a railing? : Total 6 Click Score: 10    End of Session Equipment Utilized During Treatment: Gait belt Activity Tolerance: Other (comment) (reports of vertigo like symptoms, pain 6/10 and abn sensation) Patient left: in bed;with call bell/phone within reach;with family/visitor present Nurse Communication: Mobility status (nurse aware of vertigo like symptoms prior to PT eval) PT Visit Diagnosis: Unsteadiness on feet (R26.81);Other abnormalities of gait and mobility (R26.89);Muscle weakness (generalized) (M62.81);Difficulty in walking, not elsewhere classified (R26.2);Pain Pain - Right/Left: Right Pain - part of body: Knee;Leg    Time: 7829-5621 PT Time Calculation (min) (ACUTE ONLY): 28 min   Charges:   PT Evaluation $PT Eval Low Complexity: 1 Low PT Treatments $Therapeutic Activity: 8-22 mins PT General Charges $$ ACUTE PT VISIT: 1 Visit  Johnny Bridge, PT Acute Rehab   Jacqualyn Posey 12/26/2023, 7:26 PM

## 2023-12-26 NOTE — Transfer of Care (Signed)
 Immediate Anesthesia Transfer of Care Note  Patient: Jeffrey Banks  Procedure(s) Performed: ARTHROPLASTY, KNEE, TOTAL (Right: Knee)  Patient Location: PACU  Anesthesia Type:MAC, Regional, and Spinal  Level of Consciousness: awake, alert , and oriented  Airway & Oxygen Therapy: Patient Spontanous Breathing and Patient connected to face mask oxygen  Post-op Assessment: Report given to RN and Post -op Vital signs reviewed and stable  Post vital signs: Reviewed and stable  Last Vitals:  Vitals Value Taken Time  BP 109/88   Temp    Pulse 67 12/26/23 1425  Resp 16 12/26/23 1425  SpO2 100 % 12/26/23 1425  Vitals shown include unfiled device data.  Last Pain:  Vitals:   12/26/23 1145  TempSrc:   PainSc: 0-No pain         Complications: No notable events documented.

## 2023-12-26 NOTE — Progress Notes (Signed)
 Pt denies being diagnosed with diabetes. He states providers  have told him he is borderline/ pre diabetic but not diagnosed him with an actual diabetes  diagnosis. A1c is 5.6 and blood sugar today is 96. Pt does not meet criteria for peri op glycemic protocol .

## 2023-12-26 NOTE — Anesthesia Procedure Notes (Signed)
 Anesthesia Regional Block: Adductor canal block   Pre-Anesthetic Checklist: , timeout performed,  Correct Patient, Correct Site, Correct Laterality,  Correct Procedure, Correct Position, site marked,  Risks and benefits discussed,  Surgical consent,  Pre-op evaluation,  At surgeon's request and post-op pain management  Laterality: Lower and Right  Prep: chloraprep       Needles:  Injection technique: Single-shot  Needle Type: Echogenic Needle     Needle Length: 9cm  Needle Gauge: 22     Additional Needles:   Procedures:,,,, ultrasound used (permanent image in chart),,    Narrative:  Start time: 12/26/2023 11:35 AM End time: 12/26/2023 11:42 AM Injection made incrementally with aspirations every 5 mL.  Performed by: Personally  Anesthesiologist: Trevor Iha, MD  Additional Notes: Block assessed prior to surgery. Pt tolerated procedure well.

## 2023-12-27 ENCOUNTER — Encounter (HOSPITAL_COMMUNITY): Payer: Self-pay | Admitting: Specialist

## 2023-12-27 DIAGNOSIS — M1711 Unilateral primary osteoarthritis, right knee: Secondary | ICD-10-CM | POA: Diagnosis not present

## 2023-12-27 LAB — BASIC METABOLIC PANEL
Anion gap: 10 (ref 5–15)
BUN: 23 mg/dL (ref 8–23)
CO2: 24 mmol/L (ref 22–32)
Calcium: 8.4 mg/dL — ABNORMAL LOW (ref 8.9–10.3)
Chloride: 100 mmol/L (ref 98–111)
Creatinine, Ser: 0.86 mg/dL (ref 0.61–1.24)
GFR, Estimated: >60 mL/min (ref >60)
Glucose, Bld: 139 mg/dL — ABNORMAL HIGH (ref 70–99)
Potassium: 3.9 mmol/L (ref 3.5–5.1)
Sodium: 134 mmol/L — ABNORMAL LOW (ref 135–145)

## 2023-12-27 LAB — CBC
HCT: 38.8 % — ABNORMAL LOW (ref 39.0–52.0)
Hemoglobin: 12.6 g/dL — ABNORMAL LOW (ref 13.0–17.0)
MCH: 29 pg (ref 26.0–34.0)
MCHC: 32.5 g/dL (ref 30.0–36.0)
MCV: 89.2 fL (ref 80.0–100.0)
Platelets: 236 10*3/uL (ref 150–400)
RBC: 4.35 MIL/uL (ref 4.22–5.81)
RDW: 12.6 % (ref 11.5–15.5)
WBC: 9.4 10*3/uL (ref 4.0–10.5)
nRBC: 0 % (ref 0.0–0.2)

## 2023-12-27 LAB — GLUCOSE, CAPILLARY
Glucose-Capillary: 137 mg/dL — ABNORMAL HIGH (ref 70–99)
Glucose-Capillary: 105 mg/dL — ABNORMAL HIGH (ref 70–99)
Glucose-Capillary: 120 mg/dL — ABNORMAL HIGH (ref 70–99)
Glucose-Capillary: 104 mg/dL — ABNORMAL HIGH (ref 70–99)

## 2023-12-27 NOTE — Progress Notes (Signed)
 Subjective: 1 Day Post-Op Procedure(s) (LRB): ARTHROPLASTY, KNEE, TOTAL (Right) Patient reports pain as 4 on 0-10 scale.   Denies CP or SOB.  Voiding without difficulty. Positive flatus. C/o dizziness Objective: Vital signs in last 24 hours: Temp:  [96.8 F (36 C)-99 F (37.2 C)] 99 F (37.2 C) (03/13 0624) Pulse Rate:  [42-71] 69 (03/13 0624) Resp:  [10-23] 18 (03/13 0624) BP: (108-165)/(47-88) 141/62 (03/13 0624) SpO2:  [95 %-100 %] 100 % (03/13 0624) FiO2 (%):  [21 %] 21 % (03/12 2227) Weight:  [116.5 kg] 116.5 kg (03/12 1554)  Intake/Output from previous day: 03/12 0701 - 03/13 0700 In: 2089.3 [P.O.:540; I.V.:1049.3; IV Piggyback:500] Out: 940 [Urine:930; Blood:10] Intake/Output this shift: No intake/output data recorded.  Recent Labs    12/27/23 0324  HGB 12.6*   Recent Labs    12/27/23 0324  WBC 9.4  RBC 4.35  HCT 38.8*  PLT 236   Recent Labs    12/27/23 0324  NA 134*  K 3.9  CL 100  CO2 24  BUN 23  CREATININE 0.86  GLUCOSE 139*  CALCIUM 8.4*   No results for input(s): "LABPT", "INR" in the last 72 hours.  Neurologically intact ABD soft Neurovascular intact Sensation intact distally Intact pulses distally Dorsiflexion/Plantar flexion intact No DVT  Assessment/Plan:  1 Day Post-Op Procedure(s) (LRB): ARTHROPLASTY, KNEE, TOTAL (Right) Advance diet Up with therapy    Principal Problem:   Right knee DJD   Anticipated LOS equal to or greater than 2 midnights due to - Age 38 and older with one or more of the following:  - Obesity  - Expected need for hospital services (PT, OT, Nursing) required for safe  discharge  - Anticipated need for postoperative skilled nursing care or inpatient rehab  - Active co-morbidities: Diabetes OR   - Unanticipated findings during/Post Surgery: Slow post-op progression: GI, pain control, mobility  - Patient is a high risk of re-admission due to: None   Javier Docker 12/27/2023, @[facility] 

## 2023-12-27 NOTE — Plan of Care (Signed)

## 2023-12-27 NOTE — Progress Notes (Signed)
   12/27/23 2101  BiPAP/CPAP/SIPAP  BiPAP/CPAP/SIPAP Pt Type Adult (prefers self placement)  BiPAP/CPAP/SIPAP Resmed (from home)  Dentures removed? Not applicable  EPAP 12 cmH2O  FiO2 (%) 21 %  Heater Temperature  (sterile water provided)  Patient Home Equipment Yes  Auto Titrate No  Safety Check Completed by RT for Home Unit Yes, no issues noted  BiPAP/CPAP /SiPAP Vitals  Pulse Rate 85  Resp 17  SpO2 98 %  Bilateral Breath Sounds Clear  MEWS Score/Color  MEWS Score 0  MEWS Score Color Green

## 2023-12-27 NOTE — Progress Notes (Signed)
 Physical Therapy Treatment Patient Details Name: Jeffrey Banks MRN: [id number] DOB: [birth year] Today's Date: 12/27/2023   History of Present Illness 73 yo male presents to therapy s/p R TKA on 12/26/2023 due to failure of conservative measures. Pt PMH includes but is not limited to: arthritis, LBP, depression, DM II, HLD, cervical spine surgery, vertigo, OSA on CPAP, L TKA (2016).    PT Comments  Pt very cooperative and progressing with mobility including up to ambulate limited distance in hall.  Pt continues ltd by c/o lightheadedness and pain with mobility.  Pt notes decreased lightheadedness with exiting bed from left side - states this is his norm at home.    If plan is discharge home, recommend the following: A little help with walking and/or transfers;A little help with bathing/dressing/bathroom;Assistance with cooking/housework;Assist for transportation;Help with stairs or ramp for entrance   Can travel by private vehicle        Equipment Recommendations  Rolling walker (2 wheels)    Recommendations for Other Services       Precautions / Restrictions Precautions Precautions: Knee;Fall Restrictions Weight Bearing Restrictions Per Provider Order: No Other Position/Activity Restrictions: WBAT     Mobility  Bed Mobility Overal bed mobility: Needs Assistance Bed Mobility: Supine to Sit     Supine to sit: Min assist, HOB elevated, Used rails     General bed mobility comments: Cues for sequence and use of L LE to self assist    Transfers Overall transfer level: Needs assistance Equipment used: Rolling walker (2 wheels) Transfers: Sit to/from Stand Sit to Stand: Min assist, From elevated surface           General transfer comment: cues for LE management and use of UEs to self assist.    Ambulation/Gait Ambulation/Gait assistance: Min assist Gait Distance (Feet): 45 Feet Assistive device: Rolling walker (2 wheels) Gait Pattern/deviations: Step-to pattern,  Decreased step length - right, Decreased step length - left, Shuffle, Trunk flexed Gait velocity: decr     General Gait Details: cues for posture, sequence, position from RW and to slow down for saftey.  Distance ltd by c/o pain and lightheadedness   Stairs             Wheelchair Mobility     Tilt Bed    Modified Rankin (Stroke Patients Only)       Balance Overall balance assessment: Needs assistance Sitting-balance support: Feet supported Sitting balance-Leahy Scale: Good     Standing balance support: Bilateral upper extremity supported Standing balance-Leahy Scale: Poor                              Communication Communication Communication: No apparent difficulties  Cognition Arousal: Alert Behavior During Therapy: WFL for tasks assessed/performed   PT - Cognitive impairments: No apparent impairments                         Following commands: Intact      Cueing    Exercises Total Joint Exercises Ankle Circles/Pumps: AROM, Both, 15 reps Quad Sets: AROM, Both, 10 reps, Supine Heel Slides: AAROM, Right, 15 reps, Supine Straight Leg Raises: AAROM, Right, 10 reps, Supine    General Comments        Pertinent Vitals/Pain Pain Assessment Pain Assessment: 0-10 Pain Score: 6  Pain Location: R LE and leg Pain Descriptors / Indicators: Aching, Constant, Operative site guarding, Discomfort, Throbbing Pain Intervention(s): Limited activity  within patient's tolerance, Monitored during session, Premedicated before session, Ice applied    Home Living                          Prior Function            PT Goals (current goals can now be found in the care plan section) Acute Rehab PT Goals Patient Stated Goal: to be able to walk longer distances/more steps and hunt PT Goal Formulation: With patient Time For Goal Achievement: 01/09/24 Potential to Achieve Goals: Good Progress towards PT goals: Progressing toward goals     Frequency    7X/week      PT Plan      Co-evaluation              AM-PAC PT "6 Clicks" Mobility   Outcome Measure  Help needed turning from your back to your side while in a flat bed without using bedrails?: A Little Help needed moving from lying on your back to sitting on the side of a flat bed without using bedrails?: A Little Help needed moving to and from a bed to a chair (including a wheelchair)?: A Little Help needed standing up from a chair using your arms (e.g., wheelchair or bedside chair)?: A Little Help needed to walk in hospital room?: A Little Help needed climbing 3-5 steps with a railing? : A Lot 6 Click Score: 17    End of Session Equipment Utilized During Treatment: Gait belt;Right knee immobilizer Activity Tolerance: Patient tolerated treatment well;Patient limited by fatigue Patient left: in chair;with call bell/phone within reach;with chair alarm set;with family/visitor present Nurse Communication: Mobility status PT Visit Diagnosis: Unsteadiness on feet (R26.81);Other abnormalities of gait and mobility (R26.89);Muscle weakness (generalized) (M62.81);Difficulty in walking, not elsewhere classified (R26.2);Pain Pain - Right/Left: Right Pain - part of body: Knee;Leg     Time: 0821-0903 PT Time Calculation (min) (ACUTE ONLY): 42 min  Charges:    $Gait Training: 8-22 mins $Therapeutic Exercise: 8-22 mins $Therapeutic Activity: 8-22 mins PT General Charges $$ ACUTE PT VISIT: 1 Visit                     Jeffrey Banks PT Acute Rehabilitation Services Pager [phone] Office [phone]    Jeffrey Banks 12/27/2023, 12:02 PM

## 2023-12-27 NOTE — TOC Transition Note (Signed)
 Transition of Care [facility]) - Discharge Note   Patient Details  Name: Jeffrey Banks MRN: [id number] Date of Birth: 1951-08-18  [facility] of [facility] CM/SW Contact:  Amada Jupiter, LCSW Phone Number: 12/27/2023, 9:32 AM   Clinical Narrative:    Met with pt and spouse and confirming he has received RW to room via [employer].  OPPT already arranged with [facility].  No further TOC needs.   Final next level of care: OP Rehab Barriers to Discharge: No Barriers Identified   Patient Goals and CMS Choice Patient states their goals for this hospitalization and ongoing recovery are:: return home          Discharge Placement                       Discharge Plan and Services Additional resources added to the After Visit Summary for                  DME Arranged: Walker rolling DME Agency: [employer]       HH Arranged: NA          Social Drivers of Health (SDOH) Interventions SDOH Screenings   Food Insecurity: No Food Insecurity (12/26/2023)  Housing: Low Risk  (12/26/2023)  Transportation Needs: No Transportation Needs (12/26/2023)  Utilities: Not At Risk (12/26/2023)  Social Connections: Unknown (12/26/2023)  Tobacco Use: Medium Risk (12/26/2023)     Readmission Risk Interventions     No data to display

## 2023-12-27 NOTE — Progress Notes (Signed)
 Physical Therapy Treatment Patient Details Name: Jeffrey Banks MRN: [id number] DOB: Apr 19, 1951 Today's Date: 12/27/2023   History of Present Illness 73 yo male presents to therapy s/p R TKA on 12/26/2023 due to failure of conservative measures. Pt PMH includes but is not limited to: arthritis, LBP, depression, DM II, HLD, cervical spine surgery, vertigo, OSA on CPAP, L TKA (2016).    PT Comments  Pt continues cooperative and up to ambulate increased (but still limited) distance in hall - pt continues to report mild lightheadedness but BP WNL.  Pt transported back to room via recliner 2* increasing pain and assisted from recliner to walk around bed and back to bed from L side to minimize Vestibular symptoms.    If plan is discharge home, recommend the following: A little help with walking and/or transfers;A little help with bathing/dressing/bathroom;Assistance with cooking/housework;Assist for transportation;Help with stairs or ramp for entrance   Can travel by private vehicle        Equipment Recommendations  Rolling walker (2 wheels)    Recommendations for Other Services       Precautions / Restrictions Precautions Precautions: Knee;Fall Restrictions Weight Bearing Restrictions Per Provider Order: No Other Position/Activity Restrictions: WBAT     Mobility  Bed Mobility Overal bed mobility: Needs Assistance Bed Mobility: Supine to Sit, Sit to Supine     Supine to sit: Min assist, HOB elevated, Used rails Sit to supine: Min assist, Used rails   General bed mobility comments: Cues for sequence and use of L LE to self assist    Transfers Overall transfer level: Needs assistance Equipment used: Rolling walker (2 wheels) Transfers: Sit to/from Stand Sit to Stand: Min assist, From elevated surface           General transfer comment: cues for LE management and use of UEs to self assist.    Ambulation/Gait Ambulation/Gait assistance: Min assist Gait Distance (Feet): 52  Feet (and 15') Assistive device: Rolling walker (2 wheels) Gait Pattern/deviations: Step-to pattern, Decreased step length - right, Decreased step length - left, Shuffle, Trunk flexed Gait velocity: decr     General Gait Details: cues for posture, sequence, position from RW and to slow down for saftey.  Distance ltd by c/o pain and lightheadedness   Stairs             Wheelchair Mobility     Tilt Bed    Modified Rankin (Stroke Patients Only)       Balance Overall balance assessment: Needs assistance Sitting-balance support: Feet supported Sitting balance-Leahy Scale: Good     Standing balance support: Single extremity supported Standing balance-Leahy Scale: Poor                              Communication Communication Communication: No apparent difficulties  Cognition Arousal: Alert Behavior During Therapy: WFL for tasks assessed/performed   PT - Cognitive impairments: No apparent impairments                         Following commands: Intact      Cueing    Exercises Total Joint Exercises Ankle Circles/Pumps: AROM, Both, 15 reps Quad Sets: AROM, Both, 10 reps, Supine Heel Slides: AAROM, Right, 15 reps, Supine Straight Leg Raises: AAROM, Right, 10 reps, Supine    General Comments        Pertinent Vitals/Pain Pain Assessment Pain Assessment: 0-10 Pain Score: 6  Pain Location: R  LE and leg Pain Descriptors / Indicators: Aching, Constant, Operative site guarding, Discomfort, Throbbing Pain Intervention(s): Limited activity within patient's tolerance, Monitored during session, Premedicated before session, Ice applied    Home Living                          Prior Function            PT Goals (current goals can now be found in the care plan section) Acute Rehab PT Goals Patient Stated Goal: to be able to walk longer distances/more steps and hunt PT Goal Formulation: With patient Time For Goal Achievement:  01/09/24 Potential to Achieve Goals: Good Progress towards PT goals: Progressing toward goals    Frequency    7X/week      PT Plan      Co-evaluation              AM-PAC PT "6 Clicks" Mobility   Outcome Measure  Help needed turning from your back to your side while in a flat bed without using bedrails?: A Little Help needed moving from lying on your back to sitting on the side of a flat bed without using bedrails?: A Little Help needed moving to and from a bed to a chair (including a wheelchair)?: A Little Help needed standing up from a chair using your arms (e.g., wheelchair or bedside chair)?: A Little Help needed to walk in hospital room?: A Little Help needed climbing 3-5 steps with a railing? : A Lot 6 Click Score: 17    End of Session Equipment Utilized During Treatment: Gait belt;Right knee immobilizer Activity Tolerance: Patient tolerated treatment well;Patient limited by fatigue Patient left: in bed;with call bell/phone within reach;with family/visitor present Nurse Communication: Mobility status PT Visit Diagnosis: Unsteadiness on feet (R26.81);Other abnormalities of gait and mobility (R26.89);Muscle weakness (generalized) (M62.81);Difficulty in walking, not elsewhere classified (R26.2);Pain Pain - Right/Left: Right Pain - part of body: Knee;Leg     Time: 4098-1191 PT Time Calculation (min) (ACUTE ONLY): 36 min  Charges:    $Gait Training: 23-37 mins $Therapeutic Exercise: 8-22 mins $Therapeutic Activity: 8-22 mins PT General Charges $$ ACUTE PT VISIT: 1 Visit                     [name] PT Acute Rehabilitation Services Pager [phone] Office [phone]    Jeffrey Banks 12/27/2023, 2:57 PM

## 2023-12-28 DIAGNOSIS — Z96651 Presence of right artificial knee joint: Secondary | ICD-10-CM | POA: Diagnosis not present

## 2023-12-28 DIAGNOSIS — M1711 Unilateral primary osteoarthritis, right knee: Secondary | ICD-10-CM | POA: Diagnosis not present

## 2023-12-28 LAB — CBC
HCT: 37.5 % — ABNORMAL LOW (ref 39.0–52.0)
Hemoglobin: 12 g/dL — ABNORMAL LOW (ref 13.0–17.0)
MCH: 29.1 pg (ref 26.0–34.0)
MCHC: 32 g/dL (ref 30.0–36.0)
MCV: 90.8 fL (ref 80.0–100.0)
Platelets: 217 10*3/uL (ref 150–400)
RBC: 4.13 MIL/uL — ABNORMAL LOW (ref 4.22–5.81)
RDW: 13 % (ref 11.5–15.5)
WBC: 9.6 10*3/uL (ref 4.0–10.5)
nRBC: 0 % (ref 0.0–0.2)

## 2023-12-28 LAB — GLUCOSE, CAPILLARY
Glucose-Capillary: 134 mg/dL — ABNORMAL HIGH (ref 70–99)
Glucose-Capillary: 141 mg/dL — ABNORMAL HIGH (ref 70–99)

## 2023-12-28 MED ORDER — METHOCARBAMOL 500 MG PO TABS: 500.0000 mg | ORAL_TABLET | Freq: Three times a day (TID) | ORAL | 1 refills | Status: DC | PRN

## 2023-12-28 MED ORDER — ASPIRIN 81 MG PO TBEC: 81.0000 mg | DELAYED_RELEASE_TABLET | Freq: Two times a day (BID) | ORAL | 1 refills | Status: DC

## 2023-12-28 MED ORDER — DOCUSATE SODIUM 100 MG PO CAPS: 100.0000 mg | ORAL_CAPSULE | Freq: Two times a day (BID) | ORAL | 2 refills | Status: DC

## 2023-12-28 MED ORDER — OXYCODONE HCL 10 MG PO TABS: 10.0000 mg | ORAL_TABLET | ORAL | 0 refills | Status: AC | PRN

## 2023-12-28 MED ORDER — POLYETHYLENE GLYCOL 3350 17 G PO PACK: 17.0000 g | PACK | Freq: Every day | ORAL | 0 refills | Status: DC

## 2023-12-28 NOTE — Discharge Summary (Signed)
 Physician Discharge Summary   Patient ID: Jeffrey Banks MRN: [id number] DOB/AGE: Nov 24, 1950 73 y.o.  Admit date: 12/26/2023 Discharge date: 12/28/23  Primary Diagnosis: right knee primary osteoarthritis  Admission Diagnoses:  Past Medical History:  Diagnosis Date   Arthritis    Back pain 10/17/2007   Cough    Depression    Diabetes mellitus without complication (HCC)    borderline    Hyperlipidemia    Hypertension    Impaired fasting glucose 10/16/2012   Neuromuscular disorder (HCC)    neuropathy feet   OSA (obstructive sleep apnea) 10/16/2004   on CPAP does not know settings    Rhinitis    Stenosis of cervical spine    Discharge Diagnoses:   Principal Problem:   Right knee DJD  Estimated body mass index is 39.05 kg/m as calculated from the following:   Height as of this encounter: 5\' 8"  (1.727 m).   Weight as of this encounter: 116.5 kg.  Procedure:  Procedure(s) (LRB): ARTHROPLASTY, KNEE, TOTAL (Right)   Consults: None  HPI: see H&P Laboratory Data: Admission on 12/26/2023  Component Date Value Ref Range Status   Glucose-Capillary 12/26/2023 96  70 - 99 mg/dL Final   Glucose reference range applies only to samples taken after fasting for at least 8 hours.   Glucose-Capillary 12/26/2023 89  70 - 99 mg/dL Final   Glucose reference range applies only to samples taken after fasting for at least 8 hours.   Glucose-Capillary 12/26/2023 107 (H)  70 - 99 mg/dL Final   Glucose reference range applies only to samples taken after fasting for at least 8 hours.   WBC 12/27/2023 9.4  4.0 - 10.5 K/uL Final   RBC 12/27/2023 4.35  4.22 - 5.81 MIL/uL Final   Hemoglobin 12/27/2023 12.6 (L)  13.0 - 17.0 g/dL Final   HCT 78/29/5621 38.8 (L)  39.0 - 52.0 % Final   MCV 12/27/2023 89.2  80.0 - 100.0 fL Final   MCH 12/27/2023 29.0  26.0 - 34.0 pg Final   MCHC 12/27/2023 32.5  30.0 - 36.0 g/dL Final   RDW 30/86/5784 12.6  11.5 - 15.5 % Final   Platelets 12/27/2023 236  150 - 400  K/uL Final   nRBC 12/27/2023 0.0  0.0 - 0.2 % Final   Performed at [facility], [address], [place], [place] [address]   Sodium 12/27/2023 134 (L)  135 - 145 mmol/L Final   Potassium 12/27/2023 3.9  3.5 - 5.1 mmol/L Final   Chloride 12/27/2023 100  98 - 111 mmol/L Final   CO2 12/27/2023 24  22 - 32 mmol/L Final   Glucose, Bld 12/27/2023 139 (H)  70 - 99 mg/dL Final   Glucose reference range applies only to samples taken after fasting for at least 8 hours.   BUN 12/27/2023 23  8 - 23 mg/dL Final   Creatinine, Ser 12/27/2023 0.86  0.61 - 1.24 mg/dL Final   Calcium 52/84/1324 8.4 (L)  8.9 - 10.3 mg/dL Final   GFR, Estimated 12/27/2023 >60  >60 mL/min Final   Comment: (NOTE) Calculated using the CKD-EPI Creatinine Equation (2021)    Anion gap 12/27/2023 10  5 - 15 Final   Performed at [facility], [address], [place], [place] [address]   Glucose-Capillary 12/26/2023 112 (H)  70 - 99 mg/dL Final   Glucose reference range applies only to samples taken after fasting for at least 8 hours.   Glucose-Capillary 12/27/2023 137 (  H)  70 - 99 mg/dL Final   Glucose reference range applies only to samples taken after fasting for at least 8 hours.   Glucose-Capillary 12/27/2023 105 (H)  70 - 99 mg/dL Final   Glucose reference range applies only to samples taken after fasting for at least 8 hours.   Glucose-Capillary 12/27/2023 120 (H)  70 - 99 mg/dL Final   Glucose reference range applies only to samples taken after fasting for at least 8 hours.   WBC 12/28/2023 9.6  4.0 - 10.5 K/uL Final   RBC 12/28/2023 4.13 (L)  4.22 - 5.81 MIL/uL Final   Hemoglobin 12/28/2023 12.0 (L)  13.0 - 17.0 g/dL Final   HCT 40/98/1191 37.5 (L)  39.0 - 52.0 % Final   MCV 12/28/2023 90.8  80.0 - 100.0 fL Final   MCH 12/28/2023 29.1  26.0 - 34.0 pg Final   MCHC 12/28/2023 32.0  30.0 - 36.0 g/dL Final   RDW 47/82/9562 13.0  11.5 - 15.5 % Final   Platelets 12/28/2023 217  150 -  400 K/uL Final   nRBC 12/28/2023 0.0  0.0 - 0.2 % Final   Performed at [facility], [address], [place], [place] [address]   Glucose-Capillary 12/27/2023 104 (H)  70 - 99 mg/dL Final   Glucose reference range applies only to samples taken after fasting for at least 8 hours.   Glucose-Capillary 12/28/2023 134 (H)  70 - 99 mg/dL Final   Glucose reference range applies only to samples taken after fasting for at least 8 hours.   Glucose-Capillary 12/28/2023 141 (H)  70 - 99 mg/dL Final   Glucose reference range applies only to samples taken after fasting for at least 8 hours.  Hospital Outpatient Visit on 12/19/2023  Component Date Value Ref Range Status   MRSA, PCR 12/19/2023 NEGATIVE  NEGATIVE Final   Staphylococcus aureus 12/19/2023 NEGATIVE  NEGATIVE Final   Comment: (NOTE) The Xpert SA Assay (FDA approved for NASAL specimens in patients 53 years of age and older), is one component of a comprehensive surveillance program. It is not intended to diagnose infection nor to guide or monitor treatment. Performed at [facility], [address], [place], [place] [address]    Hgb A1c MFr Bld 12/19/2023 5.6  4.8 - 5.6 % Final   Comment: (NOTE) Pre diabetes:          5.7%-6.4%  Diabetes:              >6.4%  Glycemic control for   <7.0% adults with diabetes    Mean Plasma Glucose 12/19/2023 114.02  mg/dL Final   Performed at [facility], [address] [place], [place] [address]   Sodium 12/19/2023 139  135 - 145 mmol/L Final   Potassium 12/19/2023 4.3  3.5 - 5.1 mmol/L Final   Chloride 12/19/2023 104  98 - 111 mmol/L Final   CO2 12/19/2023 25  22 - 32 mmol/L Final   Glucose, Bld 12/19/2023 145 (H)  70 - 99 mg/dL Final   Glucose reference range applies only to samples taken after fasting for at least 8 hours.   BUN 12/19/2023 27 (H)  8 - 23 mg/dL Final   Creatinine, Ser 12/19/2023 0.76  0.61 - 1.24 mg/dL Final   Calcium 28/41/3244 8.9   8.9 - 10.3 mg/dL Final   GFR, Estimated 12/19/2023 >60  >60 mL/min Final   Comment: (NOTE) Calculated using the CKD-EPI Creatinine Equation (2021)  Anion gap 12/19/2023 10  5 - 15 Final   Performed at [facility], [address], [place], [place] [address]   WBC 12/19/2023 5.4  4.0 - 10.5 K/uL Final   RBC 12/19/2023 4.83  4.22 - 5.81 MIL/uL Final   Hemoglobin 12/19/2023 14.2  13.0 - 17.0 g/dL Final   HCT 66/03/3015 44.6  39.0 - 52.0 % Final   MCV 12/19/2023 92.3  80.0 - 100.0 fL Final   MCH 12/19/2023 29.4  26.0 - 34.0 pg Final   MCHC 12/19/2023 31.8  30.0 - 36.0 g/dL Final   RDW 10/24/3233 13.1  11.5 - 15.5 % Final   Platelets 12/19/2023 256  150 - 400 K/uL Final   nRBC 12/19/2023 0.0  0.0 - 0.2 % Final   Performed at [facility], [address], [place], [place] [address]   Glucose-Capillary 12/19/2023 160 (H)  70 - 99 mg/dL Final   Glucose reference range applies only to samples taken after fasting for at least 8 hours.     X-Rays:DG Knee 1-2 Views Right Result Date: 12/26/2023 CLINICAL DATA:  Postop. EXAM: RIGHT KNEE - 1-2 VIEW COMPARISON:  None Available. FINDINGS: Right knee arthroplasty in expected alignment. No periprosthetic lucency or fracture. There has been patellar resurfacing. Recent postsurgical change includes air and edema in the soft tissues and joint space. Anterior skin staples in place. IMPRESSION: Right knee arthroplasty without immediate postoperative complication. Electronically Signed   By: Narda Rutherford M.D.   On: 12/26/2023 17:45    EKG: Orders placed or performed during the hospital encounter of 12/19/23   EKG 12 lead per protocol   EKG 12 lead per protocol     Hospital Course: Jeffrey Banks is a 73 y.o. who was admitted to [facility]. They were brought to the operating room on 12/26/2023 and underwent Procedure(s): ARTHROPLASTY, KNEE, TOTAL.  Patient tolerated the procedure well and was later  transferred to the recovery room and then to the orthopaedic floor for postoperative care.  They were given PO and IV analgesics for pain control following their surgery.  They were given 24 hours of postoperative antibiotics of  Anti-infectives (From admission, onward)    Start     Dose/Rate Route Frequency Ordered Stop   12/26/23 1800  ceFAZolin (ANCEF) IVPB 2g/100 mL premix        2 g 200 mL/hr over 30 Minutes Intravenous Every 6 hours 12/26/23 1601 12/27/23 0026   12/26/23 1000  ceFAZolin (ANCEF) IVPB 2g/100 mL premix        2 g 200 mL/hr over 30 Minutes Intravenous On call to O.R. 12/26/23 0945 12/26/23 1224      and started on DVT prophylaxis in the form of Aspirin, Fin hose, and SCDs .   PT and OT were ordered for total joint protocol.  Discharge planning consulted to help with postop disposition and equipment needs.  Patient had a difficult night on the evening of surgery.  They started to get up OOB with therapy on day one. Continued to work with therapy into day two.   By day two the patient had progressed with therapy and meeting their goals.  Incision was healing well.  Patient was seen in rounds and was ready to go home.   Diet: Diabetic diet Activity:WBAT Follow-up:in 10-14 days Disposition - Home Discharged Condition: good   Discharge Instructions     Call MD / Call 911   Complete by: As directed  If you experience chest pain or shortness of breath, CALL 911 and be transported to the hospital emergency room.  If you develope a fever above 101 F, pus (white drainage) or increased drainage or redness at the wound, or calf pain, call your surgeon's office.   Constipation Prevention   Complete by: As directed    Drink plenty of fluids.  Prune juice may be helpful.  You may use a stool softener, such as Colace (over the counter) 100 mg twice a day.  Use MiraLax (over the counter) for constipation as needed.   Diet - low sodium heart healthy   Complete by: As directed     Increase activity slowly as tolerated   Complete by: As directed    Post-operative opioid taper instructions:   Complete by: As directed    POST-OPERATIVE OPIOID TAPER INSTRUCTIONS: It is important to wean off of your opioid medication as soon as possible. If you do not need pain medication after your surgery it is ok to stop day one. Opioids include: Codeine, Hydrocodone(Norco, Vicodin), Oxycodone(Percocet, oxycontin) and hydromorphone amongst others.  Long term and even short term use of opiods can cause: Increased pain response Dependence Constipation Depression Respiratory depression And more.  Withdrawal symptoms can include Flu like symptoms Nausea, vomiting And more Techniques to manage these symptoms Hydrate well Eat regular healthy meals Stay active Use relaxation techniques(deep breathing, meditating, yoga) Do Not substitute Alcohol to help with tapering If you have been on opioids for less than two weeks and do not have pain than it is ok to stop all together.  Plan to wean off of opioids This plan should start within one week post op of your joint replacement. Maintain the same interval or time between taking each dose and first decrease the dose.  Cut the total daily intake of opioids by one tablet each day Next start to increase the time between doses. The last dose that should be eliminated is the evening dose.         Allergies as of 12/28/2023   No Known Allergies      Medication List     STOP taking these medications    HYDROcodone-acetaminophen 5-325 MG tablet Commonly known as: NORCO/VICODIN       TAKE these medications    acetaminophen 500 MG tablet Commonly known as: TYLENOL Take 500-1,000 mg by mouth every 6 (six) hours as needed (pain.).   aspirin EC 81 MG tablet Take 1 tablet (81 mg total) by mouth 2 (two) times daily after a meal. Day after surgery   docusate sodium 100 MG capsule Commonly known as: Colace Take 1 capsule (100 mg  total) by mouth 2 (two) times daily.   fexofenadine 180 MG tablet Commonly known as: ALLEGRA Take 180 mg by mouth daily as needed (seasonal allergies.).   methocarbamol 500 MG tablet Commonly known as: ROBAXIN Take 1 tablet (500 mg total) by mouth every 8 (eight) hours as needed for muscle spasms.   Movantik 25 MG Tabs tablet Generic drug: naloxegol oxalate Take 25 mg by mouth daily as needed (constipation.).   Oxycodone HCl 10 MG Tabs Take 1 tablet (10 mg total) by mouth every 4 (four) hours as needed for up to 7 days.   pioglitazone 15 MG tablet Commonly known as: ACTOS Take 15 mg by mouth in the morning.   polyethylene glycol 17 g packet Commonly known as: MIRALAX / GLYCOLAX Take 17 g by mouth daily.   propranolol 10 MG tablet  Commonly known as: INDERAL Take 10 mg by mouth 2 (two) times daily as needed (anxiety).   Repatha SureClick 140 MG/ML Soaj Generic drug: Evolocumab Inject 140 mg into the skin every 14 (fourteen) days. Wednesdays.        Follow-up Information     Jene Every, MD. Go on 01/08/2024.   Specialty: Orthopedic Surgery Why: You are scheduled for a post op appointment Tuesday 01/08/24 at 9:45am. Contact information: [address] [place] [address] [place] [address] [phone]                 Signed: Andrez Grime, PA-C Orthopaedic Surgery 12/28/2023, 3:25 PM

## 2023-12-28 NOTE — Progress Notes (Signed)
 Physical Therapy Treatment Patient Details Name: Jeffrey Banks MRN: [id number] DOB: 08/14/1951 Today's Date: 12/28/2023   History of Present Illness 73 yo male presents to therapy s/p R TKA on 12/26/2023 due to failure of conservative measures. Pt PMH includes but is not limited to: arthritis, LBP, depression, DM II, HLD, cervical spine surgery, vertigo, OSA on CPAP, L TKA (2016).    PT Comments  Pt continues cooperative and with min c/o lightheadedness this am but more pain limited - states reduced pain meds overnight.  Pt performed HEP with assist, up to EOB for dressing and ambulated limited distance in hallway.  Pt hopeful but apprehensive for dc later this date.    If plan is discharge home, recommend the following: A little help with walking and/or transfers;A little help with bathing/dressing/bathroom;Assistance with cooking/housework;Assist for transportation;Help with stairs or ramp for entrance   Can travel by private vehicle        Equipment Recommendations  Rolling walker (2 wheels)    Recommendations for Other Services       Precautions / Restrictions Precautions Precautions: Knee;Fall Restrictions Weight Bearing Restrictions Per Provider Order: No Other Position/Activity Restrictions: WBAT     Mobility  Bed Mobility Overal bed mobility: Needs Assistance Bed Mobility: Supine to Sit     Supine to sit: Min assist, HOB elevated, Used rails     General bed mobility comments: Cues for sequence and use of L LE to self assist    Transfers Overall transfer level: Needs assistance Equipment used: Rolling walker (2 wheels) Transfers: Sit to/from Stand Sit to Stand: From elevated surface, Contact guard assist           General transfer comment: cues for LE management and use of UEs to self assist.    Ambulation/Gait Ambulation/Gait assistance: Min assist, Contact guard assist Gait Distance (Feet): 38 Feet Assistive device: Rolling walker (2 wheels) Gait  Pattern/deviations: Step-to pattern, Decreased step length - right, Decreased step length - left, Shuffle, Trunk flexed Gait velocity: decr     General Gait Details: cues for posture, sequence, position from RW and to slow down for saftey.  Distance ltd by c/o pain and fatigue   Stairs             Wheelchair Mobility     Tilt Bed    Modified Rankin (Stroke Patients Only)       Balance Overall balance assessment: Needs assistance Sitting-balance support: Feet supported Sitting balance-Leahy Scale: Good     Standing balance support: No upper extremity supported Standing balance-Leahy Scale: Fair                              Hotel manager: No apparent difficulties  Cognition Arousal: Alert Behavior During Therapy: WFL for tasks assessed/performed   PT - Cognitive impairments: No apparent impairments                         Following commands: Intact      Cueing    Exercises Total Joint Exercises Ankle Circles/Pumps: AROM, Both, 15 reps Quad Sets: AROM, Both, 10 reps, Supine Short Arc Quad: AAROM, Both, 10 reps, Supine Heel Slides: AAROM, Right, 15 reps, Supine Straight Leg Raises: AAROM, Right, Supine, 20 reps    General Comments        Pertinent Vitals/Pain Pain Assessment Pain Assessment: 0-10 Pain Score: 6  Pain Location: R LE and leg Pain  Descriptors / Indicators: Aching, Constant, Operative site guarding, Discomfort, Throbbing Pain Intervention(s): Limited activity within patient's tolerance, Premedicated before session, Monitored during session, Ice applied    Home Living                          Prior Function            PT Goals (current goals can now be found in the care plan section) Acute Rehab PT Goals Patient Stated Goal: to be able to walk longer distances/more steps and hunt PT Goal Formulation: With patient Time For Goal Achievement: 01/09/24 Potential to Achieve  Goals: Good Progress towards PT goals: Progressing toward goals    Frequency    7X/week      PT Plan      Co-evaluation              AM-PAC PT "6 Clicks" Mobility   Outcome Measure  Help needed turning from your back to your side while in a flat bed without using bedrails?: A Little Help needed moving from lying on your back to sitting on the side of a flat bed without using bedrails?: A Little Help needed moving to and from a bed to a chair (including a wheelchair)?: A Little Help needed standing up from a chair using your arms (e.g., wheelchair or bedside chair)?: A Little Help needed to walk in hospital room?: A Little Help needed climbing 3-5 steps with a railing? : A Lot 6 Click Score: 17    End of Session Equipment Utilized During Treatment: Gait belt;Right knee immobilizer Activity Tolerance: Patient tolerated treatment well;Patient limited by fatigue Patient left: with call bell/phone within reach;with family/visitor present;in chair Nurse Communication: Mobility status PT Visit Diagnosis: Unsteadiness on feet (R26.81);Other abnormalities of gait and mobility (R26.89);Muscle weakness (generalized) (M62.81);Difficulty in walking, not elsewhere classified (R26.2);Pain Pain - Right/Left: Right Pain - part of body: Knee;Leg     Time: 0813-0850 PT Time Calculation (min) (ACUTE ONLY): 37 min  Charges:    $Gait Training: 8-22 mins $Therapeutic Exercise: 8-22 mins PT General Charges $$ ACUTE PT VISIT: 1 Visit                     Jeffrey Banks PT Acute Rehabilitation Services Pager [phone] Office [phone]    Jeffrey Banks 12/28/2023, 8:56 AM

## 2023-12-28 NOTE — Progress Notes (Signed)
 Orthopedic Tech Progress Note Patient Details:  Jeffrey Banks 13-Nov-1950 [id number] Crutches were sized to fit this patient.  Ortho Devices Type of Ortho Device: Crutches Ortho Device/Splint Interventions: Ordered, Adjustment      [place] Fritze E Nida Manfredi 12/28/2023, 1:36 PM

## 2023-12-28 NOTE — Progress Notes (Signed)
 Physical Therapy Treatment Patient Details Name: Jeffrey Banks MRN: [id number] DOB: 1951-06-12 Today's Date: 12/28/2023   History of Present Illness 73 yo male presents to therapy s/p R TKA on 12/26/2023 due to failure of conservative measures. Pt PMH includes but is not limited to: arthritis, LBP, depression, DM II, HLD, cervical spine surgery, vertigo, OSA on CPAP, L TKA (2016).    PT Comments  PT called back to room with pt reporting one of stairs has no kick plate so method to entering house will not work.  Pt up to hallway and negotiated stairs with crutches, increased time, min assist and cues for sequence.  Spouse present.  Pt declines second attempt 2* fatigue.  Written instruction provided.    If plan is discharge home, recommend the following: A little help with walking and/or transfers;A little help with bathing/dressing/bathroom;Assistance with cooking/housework;Assist for transportation;Help with stairs or ramp for entrance   Can travel by private vehicle        Equipment Recommendations  Rolling walker (2 wheels)    Recommendations for Other Services       Precautions / Restrictions Precautions Precautions: Knee;Fall Restrictions Weight Bearing Restrictions Per Provider Order: No Other Position/Activity Restrictions: WBAT     Mobility  Bed Mobility Overal bed mobility: Needs Assistance Bed Mobility: Supine to Sit     Supine to sit: Min assist, HOB elevated, Used rails     General bed mobility comments: Up in recliner and returns to same    Transfers Overall transfer level: Needs assistance Equipment used: Rolling walker (2 wheels) Transfers: Sit to/from Stand Sit to Stand: Contact guard assist           General transfer comment: cues for LE management and use of UEs to self assist.  Assist to bring wt up from low chair    Ambulation/Gait Ambulation/Gait assistance: Contact guard assist Gait Distance (Feet): 25 Feet Assistive device: Rolling walker  (2 wheels) Gait Pattern/deviations: Step-to pattern, Decreased step length - right, Decreased step length - left, Shuffle, Trunk flexed Gait velocity: decr     General Gait Details: cues for posture, sequence, position from RW and to slow down for saftey.  Distance ltd by c/o pain and fatigue   Stairs Stairs: Yes Stairs assistance: Min assist Stair Management: No rails, Forwards, With crutches, Step to pattern Number of Stairs: 3 General stair comments: cues for sequence and foot/crutch placement   Wheelchair Mobility     Tilt Bed    Modified Rankin (Stroke Patients Only)       Balance Overall balance assessment: Needs assistance Sitting-balance support: Feet supported Sitting balance-Leahy Scale: Good     Standing balance support: No upper extremity supported Standing balance-Leahy Scale: Fair                              Hotel manager: No apparent difficulties  Cognition Arousal: Alert Behavior During Therapy: WFL for tasks assessed/performed   PT - Cognitive impairments: No apparent impairments                         Following commands: Intact      Cueing    Exercises Total Joint Exercises Ankle Circles/Pumps: AROM, Both, 15 reps Quad Sets: AROM, Both, 10 reps, Supine Short Arc Quad: AAROM, Both, 10 reps, Supine Heel Slides: AAROM, Right, 15 reps, Supine Straight Leg Raises: AAROM, Right, Supine, 20 reps  General Comments        Pertinent Vitals/Pain Pain Assessment Pain Assessment: 0-10 Pain Score: 6  Pain Location: R LE and leg Pain Descriptors / Indicators: Aching, Constant, Operative site guarding, Discomfort, Throbbing Pain Intervention(s): Limited activity within patient's tolerance, Monitored during session, Premedicated before session    Home Living                          Prior Function            PT Goals (current goals can now be found in the care plan section)  Acute Rehab PT Goals Patient Stated Goal: to be able to walk longer distances/more steps and hunt PT Goal Formulation: With patient Time For Goal Achievement: 01/09/24 Potential to Achieve Goals: Good Progress towards PT goals: Progressing toward goals    Frequency    7X/week      PT Plan      Co-evaluation              AM-PAC PT "6 Clicks" Mobility   Outcome Measure  Help needed turning from your back to your side while in a flat bed without using bedrails?: A Little Help needed moving from lying on your back to sitting on the side of a flat bed without using bedrails?: A Little Help needed moving to and from a bed to a chair (including a wheelchair)?: A Little Help needed standing up from a chair using your arms (e.g., wheelchair or bedside chair)?: A Little Help needed to walk in hospital room?: A Little Help needed climbing 3-5 steps with a railing? : A Little 6 Click Score: 18    End of Session Equipment Utilized During Treatment: Gait belt;Right knee immobilizer Activity Tolerance: Patient tolerated treatment well;Patient limited by fatigue Patient left: with call bell/phone within reach;with family/visitor present;in chair Nurse Communication: Mobility status PT Visit Diagnosis: Unsteadiness on feet (R26.81);Other abnormalities of gait and mobility (R26.89);Muscle weakness (generalized) (M62.81);Difficulty in walking, not elsewhere classified (R26.2);Pain Pain - Right/Left: Right Pain - part of body: Knee;Leg     Time: 1610-9604 PT Time Calculation (min) (ACUTE ONLY): 17 min  Charges:    $Gait Training: 8-22 mins $Therapeutic Activity: 8-22 mins PT General Charges $$ ACUTE PT VISIT: 1 Visit                     [name] PT Acute Rehabilitation Services Pager [phone] Office [phone]    Soniyah Mcglory 12/28/2023, 1:26 PM

## 2023-12-28 NOTE — Progress Notes (Signed)
 Physical Therapy Treatment Patient Details Name: Jeffrey Banks MRN: [id number] DOB: 07-21-1951 Today's Date: 12/28/2023   History of Present Illness 73 yo male presents to therapy s/p R TKA on 12/26/2023 due to failure of conservative measures. Pt PMH includes but is not limited to: arthritis, LBP, depression, DM II, HLD, cervical spine surgery, vertigo, OSA on CPAP, L TKA (2016).    PT Comments  Pt continues very cooperative and up to ambulate in hall and negotiated stairs with spouse assisting.  KI don/doff reviewed with pt and spouse and written HEP provided.    If plan is discharge home, recommend the following: A little help with walking and/or transfers;A little help with bathing/dressing/bathroom;Assistance with cooking/housework;Assist for transportation;Help with stairs or ramp for entrance   Can travel by private vehicle        Equipment Recommendations  Rolling walker (2 wheels)    Recommendations for Other Services       Precautions / Restrictions Precautions Precautions: Knee;Fall Restrictions Weight Bearing Restrictions Per Provider Order: No Other Position/Activity Restrictions: WBAT     Mobility  Bed Mobility Overal bed mobility: Needs Assistance Bed Mobility: Supine to Sit     Supine to sit: Min assist, HOB elevated, Used rails     General bed mobility comments: Up in recliner and returns to same    Transfers Overall transfer level: Needs assistance Equipment used: Rolling walker (2 wheels) Transfers: Sit to/from Stand Sit to Stand: Min assist           General transfer comment: cues for LE management and use of UEs to self assist.  Assist to bring wt up from low chair    Ambulation/Gait Ambulation/Gait assistance: Contact guard assist Gait Distance (Feet): 56 Feet Assistive device: Rolling walker (2 wheels) Gait Pattern/deviations: Step-to pattern, Decreased step length - right, Decreased step length - left, Shuffle, Trunk flexed Gait  velocity: decr     General Gait Details: cues for posture, sequence, position from RW and to slow down for saftey.  Distance ltd by c/o pain and fatigue   Stairs Stairs: Yes Stairs assistance: Min assist Stair Management: No rails, Step to pattern, Backwards, With walker Number of Stairs: 4 General stair comments: 2 steps twice with RW and cues for sequence; spouse assisting on second attempt   Wheelchair Mobility     Tilt Bed    Modified Rankin (Stroke Patients Only)       Balance Overall balance assessment: Needs assistance Sitting-balance support: Feet supported Sitting balance-Leahy Scale: Good     Standing balance support: No upper extremity supported Standing balance-Leahy Scale: Fair                              Hotel manager: No apparent difficulties  Cognition Arousal: Alert Behavior During Therapy: WFL for tasks assessed/performed   PT - Cognitive impairments: No apparent impairments                         Following commands: Intact      Cueing    Exercises Total Joint Exercises Ankle Circles/Pumps: AROM, Both, 15 reps Quad Sets: AROM, Both, 10 reps, Supine Short Arc Quad: AAROM, Both, 10 reps, Supine Heel Slides: AAROM, Right, 15 reps, Supine Straight Leg Raises: AAROM, Right, Supine, 20 reps    General Comments        Pertinent Vitals/Pain Pain Assessment Pain Assessment: 0-10 Pain Score: 6  Pain Location: R LE and leg Pain Descriptors / Indicators: Aching, Constant, Operative site guarding, Discomfort, Throbbing Pain Intervention(s): Limited activity within patient's tolerance, Monitored during session, Premedicated before session, Ice applied    Home Living                          Prior Function            PT Goals (current goals can now be found in the care plan section) Acute Rehab PT Goals Patient Stated Goal: to be able to walk longer distances/more steps and  hunt PT Goal Formulation: With patient Time For Goal Achievement: 01/09/24 Potential to Achieve Goals: Good Progress towards PT goals: Progressing toward goals    Frequency    7X/week      PT Plan      Co-evaluation              AM-PAC PT "6 Clicks" Mobility   Outcome Measure  Help needed turning from your back to your side while in a flat bed without using bedrails?: A Little Help needed moving from lying on your back to sitting on the side of a flat bed without using bedrails?: A Little Help needed moving to and from a bed to a chair (including a wheelchair)?: A Little Help needed standing up from a chair using your arms (e.g., wheelchair or bedside chair)?: A Little Help needed to walk in hospital room?: A Little Help needed climbing 3-5 steps with a railing? : A Little 6 Click Score: 18    End of Session Equipment Utilized During Treatment: Gait belt;Right knee immobilizer Activity Tolerance: Patient tolerated treatment well;Patient limited by fatigue Patient left: with call bell/phone within reach;with family/visitor present;in chair Nurse Communication: Mobility status PT Visit Diagnosis: Unsteadiness on feet (R26.81);Other abnormalities of gait and mobility (R26.89);Muscle weakness (generalized) (M62.81);Difficulty in walking, not elsewhere classified (R26.2);Pain Pain - Right/Left: Right Pain - part of body: Knee;Leg     Time: 1610-9604 PT Time Calculation (min) (ACUTE ONLY): 35 min  Charges:    $Gait Training: 8-22 mins $Therapeutic Activity: 8-22 mins PT General Charges $$ ACUTE PT VISIT: 1 Visit                     [name] PT Acute Rehabilitation Services Pager [phone] Office [phone]    Jeffrey Banks 12/28/2023, 1:00 PM

## 2023-12-28 NOTE — Progress Notes (Signed)
 Subjective: 2 Days Post-Op Procedure(s) (LRB): ARTHROPLASTY, KNEE, TOTAL (Right) Patient reports pain as moderate.   Doing better today, no dizziness  Objective: Vital signs in last 24 hours: Temp:  [98.5 F (36.9 C)-99.6 F (37.6 C)] 98.5 F (36.9 C) (03/14 0641) Pulse Rate:  [58-85] 78 (03/14 0641) Resp:  [16-17] 17 (03/13 2101) BP: (126-141)/(52-76) 141/56 (03/14 0641) SpO2:  [94 %-98 %] 95 % (03/14 0641) FiO2 (%):  [21 %] 21 % (03/13 2101)  Intake/Output from previous day: 03/13 0701 - 03/14 0700 In: 537.3 [P.O.:240; I.V.:297.3] Out: 400 [Urine:400] Intake/Output this shift: No intake/output data recorded.  Recent Labs    12/27/23 0324 12/28/23 0312  HGB 12.6* 12.0*   Recent Labs    12/27/23 0324 12/28/23 0312  WBC 9.4 9.6  RBC 4.35 4.13*  HCT 38.8* 37.5*  PLT 236 217   Recent Labs    12/27/23 0324  NA 134*  K 3.9  CL 100  CO2 24  BUN 23  CREATININE 0.86  GLUCOSE 139*  CALCIUM 8.4*   No results for input(s): "LABPT", "INR" in the last 72 hours.  Neurologically intact ABD soft Neurovascular intact Sensation intact distally Intact pulses distally Dorsiflexion/Plantar flexion intact Incision: dressing C/D/I and no drainage No cellulitis present Compartment soft No sign of DVT   Assessment/Plan: 2 Days Post-Op Procedure(s) (LRB): ARTHROPLASTY, KNEE, TOTAL (Right) Advance diet Up with therapy D/C IV fluids Plan d/c to home later today   Dorothy Spark 12/28/2023, 9:03 AM

## 2023-12-31 DIAGNOSIS — M25561 Pain in right knee: Secondary | ICD-10-CM | POA: Diagnosis not present

## 2024-01-08 DIAGNOSIS — Z5189 Encounter for other specified aftercare: Secondary | ICD-10-CM | POA: Diagnosis not present

## 2024-02-18 DIAGNOSIS — M25561 Pain in right knee: Secondary | ICD-10-CM | POA: Diagnosis not present

## 2024-02-18 DIAGNOSIS — M25661 Stiffness of right knee, not elsewhere classified: Secondary | ICD-10-CM | POA: Diagnosis not present

## 2024-02-20 DIAGNOSIS — Z5189 Encounter for other specified aftercare: Secondary | ICD-10-CM | POA: Diagnosis not present

## 2024-02-21 ENCOUNTER — Ambulatory Visit: Payer: Self-pay | Admitting: Orthopedic Surgery

## 2024-02-21 NOTE — H&P (Signed)
 Jeffrey Banks is an 73 y.o. male.   Chief Complaint: right knee wound HPI: The patient is here today for follow-up of his right knee arthroplasty; performed on 12/26/23; The patient is 8 weeks out from their surgery.; incision check The patient is having no pain; feels tight The patient Doxycycline; Tylenol  prn Patient follows up no fevers or chills. He is started draining a little bit more.  Past Medical History:  Diagnosis Date   Arthritis    Back pain 10/17/2007   Cough    Depression    Diabetes mellitus without complication (HCC)    borderline    Hyperlipidemia    Hypertension    Impaired fasting glucose 10/16/2012   Neuromuscular disorder (HCC)    neuropathy feet   OSA (obstructive sleep apnea) 10/16/2004   on CPAP does not know settings    Rhinitis    Stenosis of cervical spine     Past Surgical History:  Procedure Laterality Date   bilateral cataract surgery      with lens implants    lamenectomy  2010   TONSILLECTOMY  1969   TOTAL KNEE ARTHROPLASTY Left 05/06/2015   Procedure: LEFT TOTAL KNEE ARTHROPLASTY;  Surgeon: Orvan Blanch, MD;  Location: [facility];  Service: Orthopedics;  Laterality: Left;   TOTAL KNEE ARTHROPLASTY Right 12/26/2023   Procedure: ARTHROPLASTY, KNEE, TOTAL;  Surgeon: Orvan Blanch, MD;  Location: [facility];  Service: Orthopedics;  Laterality: Right;    Family History  Problem Relation Age of Onset   High Cholesterol Mother    Heart failure Father    Social History:  reports that he has quit smoking. His smoking use included cigarettes. He has quit using smokeless tobacco. He reports that he does not drink alcohol  and does not use drugs.  Allergies: No Known Allergies  Current meds: Allegra Allergy doxycycline hyclate 100 mg capsule pioglitazone 15 mg tablet Repatha SureClick  Review of Systems  Constitutional: Negative.   HENT: Negative.    Eyes: Negative.   Respiratory: Negative.    Cardiovascular: Negative.   Endocrine: Negative.    Genitourinary: Negative.   Musculoskeletal:  Positive for joint swelling and myalgias.  Neurological: Negative.   Psychiatric/Behavioral: Negative.      There were no vitals taken for this visit. Physical Exam Constitutional:      Appearance: Normal appearance.  HENT:     Head: Normocephalic and atraumatic.     Right Ear: External ear normal.     Left Ear: External ear normal.     Nose: Nose normal.     Mouth/Throat:     Pharynx: Oropharynx is clear.  Eyes:     Conjunctiva/sclera: Conjunctivae normal.  Cardiovascular:     Rate and Rhythm: Normal rate.     Pulses: Normal pulses.     Heart sounds: Normal heart sounds.  Pulmonary:     Effort: Pulmonary effort is normal.  Abdominal:     General: Bowel sounds are normal.  Musculoskeletal:     Cervical back: Normal range of motion.     Comments: On exam the the area of eschar now it looks like it is enlarged and centrally there is a white devitalized tissue it measures approximately 1 x 1-1/2 cm. And expressing serosanguineous fluid which I cultured. There is no DVT there is no effusion of the knee he has no pain in the knee.  Skin:    General: Skin is warm and dry.  Neurological:     Mental Status: He is  alert.    X-rays of the tibia demonstrate no subcutaneous gas or abnormality.   Assessment/Plan Impression:  1. 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 We discussed risks and benefits including inability to do so. Nonhealing wound. Excetra.  Continue the antibiotics in the interim.  Will send the culture off. Call if there are any changes. Continue intermittent elevation. Probiotic  Plan revision of right total knee incision  Barba Levin, PA-C for Dr Leighton Punches 02/21/2024, 12:06  PM

## 2024-02-21 NOTE — H&P (View-Only) (Signed)
 Jeffrey Banks is an 73 y.o. male.   Chief Complaint: right knee wound HPI: The patient is here today for follow-up of his right knee arthroplasty; performed on 12/26/23; The patient is 8 weeks out from their surgery.; incision check The patient is having no pain; feels tight The patient Doxycycline; Tylenol  prn Patient follows up no fevers or chills. He is started draining a little bit more.  Past Medical History:  Diagnosis Date   Arthritis    Back pain 10/17/2007   Cough    Depression    Diabetes mellitus without complication (HCC)    borderline    Hyperlipidemia    Hypertension    Impaired fasting glucose 10/16/2012   Neuromuscular disorder (HCC)    neuropathy feet   OSA (obstructive sleep apnea) 10/16/2004   on CPAP does not know settings    Rhinitis    Stenosis of cervical spine     Past Surgical History:  Procedure Laterality Date   bilateral cataract surgery      with lens implants    lamenectomy  2010   TONSILLECTOMY  1969   TOTAL KNEE ARTHROPLASTY Left 05/06/2015   Procedure: LEFT TOTAL KNEE ARTHROPLASTY;  Surgeon: Orvan Blanch, MD;  Location: [facility];  Service: Orthopedics;  Laterality: Left;   TOTAL KNEE ARTHROPLASTY Right 12/26/2023   Procedure: ARTHROPLASTY, KNEE, TOTAL;  Surgeon: Orvan Blanch, MD;  Location: [facility];  Service: Orthopedics;  Laterality: Right;    Family History  Problem Relation Age of Onset   High Cholesterol Mother    Heart failure Father    Social History:  reports that he has quit smoking. His smoking use included cigarettes. He has quit using smokeless tobacco. He reports that he does not drink alcohol  and does not use drugs.  Allergies: No Known Allergies  Current meds: Allegra Allergy doxycycline hyclate 100 mg capsule pioglitazone 15 mg tablet Repatha SureClick  Review of Systems  Constitutional: Negative.   HENT: Negative.    Eyes: Negative.   Respiratory: Negative.    Cardiovascular: Negative.   Endocrine: Negative.    Genitourinary: Negative.   Musculoskeletal:  Positive for joint swelling and myalgias.  Neurological: Negative.   Psychiatric/Behavioral: Negative.      There were no vitals taken for this visit. Physical Exam Constitutional:      Appearance: Normal appearance.  HENT:     Head: Normocephalic and atraumatic.     Right Ear: External ear normal.     Left Ear: External ear normal.     Nose: Nose normal.     Mouth/Throat:     Pharynx: Oropharynx is clear.  Eyes:     Conjunctiva/sclera: Conjunctivae normal.  Cardiovascular:     Rate and Rhythm: Normal rate.     Pulses: Normal pulses.     Heart sounds: Normal heart sounds.  Pulmonary:     Effort: Pulmonary effort is normal.  Abdominal:     General: Bowel sounds are normal.  Musculoskeletal:     Cervical back: Normal range of motion.     Comments: On exam the the area of eschar now it looks like it is enlarged and centrally there is a white devitalized tissue it measures approximately 1 x 1-1/2 cm. And expressing serosanguineous fluid which I cultured. There is no DVT there is no effusion of the knee he has no pain in the knee.  Skin:    General: Skin is warm and dry.  Neurological:     Mental Status: He is  alert.    X-rays of the tibia demonstrate no subcutaneous gas or abnormality.   Assessment/Plan Impression:  1. 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 We discussed risks and benefits including inability to do so. Nonhealing wound. Excetra.  Continue the antibiotics in the interim.  Will send the culture off. Call if there are any changes. Continue intermittent elevation. Probiotic  Plan revision of right total knee incision  Barba Levin, PA-C for Dr Leighton Punches 02/21/2024, 12:06  PM

## 2024-02-25 NOTE — Patient Instructions (Signed)
 SURGICAL WAITING ROOM VISITATION  Patients having surgery or a procedure may have no more than 2 support people in the waiting area - these visitors may rotate.    Children under the age of 1 must have an adult with them who is not the patient.  Due to an increase in RSV and influenza rates and associated hospitalizations, children ages 72 and under may not visit patients in [facility] hospitals.  Visitors with respiratory illnesses are discouraged from visiting and should remain at home.  If the patient needs to stay at the hospital during part of their recovery, the visitor guidelines for inpatient rooms apply. Pre-op nurse will coordinate an appropriate time for 1 support person to accompany patient in pre-op.  This support person may not rotate.    Please refer to the [facility] website for the visitor guidelines for Inpatients (after your surgery is over and you are in a regular room).       Your procedure is scheduled on: 02/27/24   Report to [facility] Main Entrance    Report to admitting at : 10:40 AM   Call this number if you have problems the morning of surgery [phone]   Do not eat food :After Midnight.   After Midnight you may have the following liquids until : 9:50 AM DAY OF SURGERY  Water  Non-Citrus Juices (without pulp, NO RED-Apple, White grape, White cranberry) Black Coffee (NO MILK/CREAM OR CREAMERS, sugar ok)  Clear Tea (NO MILK/CREAM OR CREAMERS, sugar ok) regular and decaf                             Plain Jell-O (NO RED)                                           Fruit ices (not with fruit pulp, NO RED)                                     Popsicles (NO RED)                                                               Sports drinks like Gatorade (NO RED)   The day of surgery:  Drink ONE (1) Pre-Surgery Clear G2 at : 9:50 AM the morning of surgery. Drink in one sitting. Do not sip.  This drink was given to you during your hospital  pre-op  appointment visit. Nothing else to drink after completing the  Pre-Surgery Clear Ensure or G2.          If you have questions, please contact your surgeon's office.  FOLLOW ANY ADDITIONAL PRE OP INSTRUCTIONS YOU RECEIVED FROM YOUR SURGEON'S OFFICE!!!   Oral Hygiene is also important to reduce your risk of infection.                                    Remember - BRUSH YOUR TEETH THE MORNING OF SURGERY WITH YOUR REGULAR TOOTHPASTE  DENTURES WILL BE REMOVED PRIOR TO SURGERY PLEASE DO NOT APPLY "Poly grip" OR ADHESIVES!!!   Do NOT smoke after Midnight   Stop all vitamins and herbal supplements 7 days before surgery.   Take these medicines the morning of surgery with A SIP OF WATER : fexofenadine.Tylenol ,propanolol as needed.  DO NOT TAKE ANY ORAL DIABETIC MEDICATIONS DAY OF YOUR SURGERY                              You may not have any metal on your body including hair pins, jewelry, and body piercing             Do not wear lotions, powders, perfumes/cologne, or deodorant              Men may shave face and neck.   Do not bring valuables to the hospital. [place] IS NOT             RESPONSIBLE   FOR VALUABLES.   Contacts, glasses, dentures or bridgework may not be worn into surgery.   Bring small overnight bag day of surgery.   DO NOT BRING YOUR HOME MEDICATIONS TO THE HOSPITAL. PHARMACY WILL DISPENSE MEDICATIONS LISTED ON YOUR MEDICATION LIST TO YOU DURING YOUR ADMISSION IN THE HOSPITAL!    Patients discharged on the day of surgery will not be allowed to drive home.  Someone NEEDS to stay with you for the first 24 hours after anesthesia.   Special Instructions: Bring a copy of your healthcare power of attorney and living will documents the day of surgery if you haven't scanned them before.              Please read over the following fact sheets you were given: IF YOU HAVE QUESTIONS ABOUT YOUR PRE-OP INSTRUCTIONS PLEASE CALL [phone]   If you received a COVID test  during your pre-op visit  it is requested that you wear a mask when out in public, stay away from anyone that may not be feeling well and notify your surgeon if you develop symptoms. If you test positive for Covid or have been in contact with anyone that has tested positive in the last 10 days please notify you surgeon.    [place] - Preparing for Surgery Before surgery, you can play an important role.  Because skin is not sterile, your skin needs to be as free of germs as possible.  You can reduce the number of germs on your skin by washing with CHG (chlorahexidine gluconate) soap before surgery.  CHG is an antiseptic cleaner which kills germs and bonds with the skin to continue killing germs even after washing. Please DO NOT use if you have an allergy to CHG or antibacterial soaps.  If your skin becomes reddened/irritated stop using the CHG and inform your nurse when you arrive at Short Stay. Do not shave (including legs and underarms) for at least 48 hours prior to the first CHG shower.  You may shave your face/neck. Please follow these instructions carefully:  1.  Shower with CHG Soap the night before surgery and the  morning of Surgery.  2.  If you choose to wash your hair, wash your hair first as usual with your  normal  shampoo.  3.  After you shampoo, rinse your hair and body thoroughly to remove the  shampoo.  4.  Use CHG as you would any other liquid soap.  You can apply chg directly  to the skin and wash                       Gently with a scrungie or clean washcloth.  5.  Apply the CHG Soap to your body ONLY FROM THE NECK DOWN.   Do not use on face/ open                           Wound or open sores. Avoid contact with eyes, ears mouth and genitals (private parts).                       Wash face,  Genitals (private parts) with your normal soap.             6.  Wash thoroughly, paying special attention to the area where your surgery  will be performed.  7.   Thoroughly rinse your body with warm water  from the neck down.  8.  DO NOT shower/wash with your normal soap after using and rinsing off  the CHG Soap.                9.  Pat yourself dry with a clean towel.            10.  Wear clean pajamas.            11.  Place clean sheets on your bed the night of your first shower and do not  sleep with pets. Day of Surgery : Do not apply any lotions/deodorants the morning of surgery.  Please wear clean clothes to the hospital/surgery center.  FAILURE TO FOLLOW THESE INSTRUCTIONS MAY RESULT IN THE CANCELLATION OF YOUR SURGERY PATIENT SIGNATURE_________________________________  NURSE SIGNATURE__________________________________  ________________________________________________________________________  Jeffrey Banks  An incentive spirometer is a tool that can help keep your lungs clear and active. This tool measures how well you are filling your lungs with each breath. Taking long deep breaths may help reverse or decrease the chance of developing breathing (pulmonary) problems (especially infection) following: A long period of time when you are unable to move or be active. BEFORE THE PROCEDURE  If the spirometer includes an indicator to show your best effort, your nurse or respiratory therapist will set it to a desired goal. If possible, sit up straight or lean slightly forward. Try not to slouch. Hold the incentive spirometer in an upright position. INSTRUCTIONS FOR USE  Sit on the edge of your bed if possible, or sit up as far as you can in bed or on a chair. Hold the incentive spirometer in an upright position. Breathe out normally. Place the mouthpiece in your mouth and seal your lips tightly around it. Breathe in slowly and as deeply as possible, raising the piston or the ball toward the top of the column. Hold your breath for 3-5 seconds or for as long as possible. Allow the piston or ball to fall to the bottom of the column. Remove the  mouthpiece from your mouth and breathe out normally. Rest for a few seconds and repeat Steps 1 through 7 at least 10 times every 1-2 hours when you are awake. Take your time and take a few normal breaths between deep breaths. The spirometer may include an indicator to show your best effort. Use the indicator as a goal to work toward during  each repetition. After each set of 10 deep breaths, practice coughing to be sure your lungs are clear. If you have an incision (the cut made at the time of surgery), support your incision when coughing by placing a pillow or rolled up towels firmly against it. Once you are able to get out of bed, walk around indoors and cough well. You may stop using the incentive spirometer when instructed by your caregiver.  RISKS AND COMPLICATIONS Take your time so you do not get dizzy or light-headed. If you are in pain, you may need to take or ask for pain medication before doing incentive spirometry. It is harder to take a deep breath if you are having pain. AFTER USE Rest and breathe slowly and easily. It can be helpful to keep track of a log of your progress. Your caregiver can provide you with a simple table to help with this. If you are using the spirometer at home, follow these instructions: SEEK MEDICAL CARE IF:  You are having difficultly using the spirometer. You have trouble using the spirometer as often as instructed. Your pain medication is not giving enough relief while using the spirometer. You develop fever of 100.5 F (38.1 C) or higher. SEEK IMMEDIATE MEDICAL CARE IF:  You cough up bloody sputum that had not been present before. You develop fever of 102 F (38.9 C) or greater. You develop worsening pain at or near the incision site. MAKE SURE YOU:  Understand these instructions. Will watch your condition. Will get help right away if you are not doing well or get worse. Document Released: 02/12/2007 Document Revised: 12/25/2011 Document Reviewed:  04/15/2007 [facility] Patient Information 2014 [place], [place].   ________________________________________________________________________

## 2024-02-26 ENCOUNTER — Encounter (HOSPITAL_COMMUNITY)
Admission: RE | Admit: 2024-02-26 | Discharge: 2024-02-26 | Disposition: A | Discharge status: Home / Self Care | Source: Ambulatory Visit | Attending: Specialist | Admitting: Specialist

## 2024-02-26 ENCOUNTER — Other Ambulatory Visit: Payer: Self-pay

## 2024-02-26 ENCOUNTER — Encounter (HOSPITAL_COMMUNITY): Payer: Self-pay

## 2024-02-26 VITALS — BP 152/85 | HR 76 | Temp 97.9°F | Ht 68.5 in | Wt 251.0 lb

## 2024-02-26 DIAGNOSIS — Z01818 Encounter for other preprocedural examination: Secondary | ICD-10-CM | POA: Diagnosis present

## 2024-02-26 DIAGNOSIS — E119 Type 2 diabetes mellitus without complications: Secondary | ICD-10-CM | POA: Diagnosis not present

## 2024-02-26 DIAGNOSIS — I10 Essential (primary) hypertension: Secondary | ICD-10-CM | POA: Insufficient documentation

## 2024-02-26 LAB — CBC
HCT: 42.3 % (ref 39.0–52.0)
Hemoglobin: 13 g/dL (ref 13.0–17.0)
MCH: 28.1 pg (ref 26.0–34.0)
MCHC: 30.7 g/dL (ref 30.0–36.0)
MCV: 91.4 fL (ref 80.0–100.0)
Platelets: 266 10*3/uL (ref 150–400)
RBC: 4.63 MIL/uL (ref 4.22–5.81)
RDW: 14.1 % (ref 11.5–15.5)
WBC: 4.5 10*3/uL (ref 4.0–10.5)
nRBC: 0 % (ref 0.0–0.2)

## 2024-02-26 LAB — BASIC METABOLIC PANEL WITH GFR
Anion gap: 6 (ref 5–15)
BUN: 24 mg/dL — ABNORMAL HIGH (ref 8–23)
CO2: 26 mmol/L (ref 22–32)
Calcium: 9.1 mg/dL (ref 8.9–10.3)
Chloride: 103 mmol/L (ref 98–111)
Creatinine, Ser: 0.76 mg/dL (ref 0.61–1.24)
GFR, Estimated: >60 mL/min (ref >60)
Glucose, Bld: 99 mg/dL (ref 70–99)
Potassium: 4.2 mmol/L (ref 3.5–5.1)
Sodium: 135 mmol/L (ref 135–145)

## 2024-02-26 LAB — SURGICAL PCR SCREEN
MRSA, PCR: NEGATIVE
Staphylococcus aureus: NEGATIVE

## 2024-02-26 NOTE — Progress Notes (Addendum)
 For Anesthesia: PCP - Bertha Broad, MD  Cardiologist - N/A  Bowel Prep reminder:  Chest x-ray -  EKG - 12/19/23 Stress Test -  ECHO -  Cardiac Cath -  Pacemaker/ICD device last checked: Pacemaker orders received: Device Rep notified:  Spinal Cord Stimulator:N/A  Sleep Study - Yes CPAP - Yes  Fasting Blood Sugar - N/A Checks Blood Sugar __0___ times a day Date and result of last Hgb A1c- 5.6: 12/19/23  Last dose of GLP1 agonist- N/A GLP1 instructions:   Last dose of SGLT-2 inhibitors- N/A SGLT-2 instructions:   Blood Thinner Instructions: Aspirin  Instructions: On Hold Last Dose:  Activity level: Can go up a flight of stairs and activities of daily living without stopping and without chest pain and/or shortness of breath   Able to exercise without chest pain and/or shortness of breath  Anesthesia review: Hx: HTN,OSA(CPAP),DIA  Patient denies shortness of breath, fever, cough and chest pain at PAT appointment   Patient verbalized understanding of instructions that were given to them at the PAT appointment. Patient was also instructed that they will need to review over the PAT instructions again at home before surgery.

## 2024-02-27 ENCOUNTER — Other Ambulatory Visit: Payer: Self-pay

## 2024-02-27 ENCOUNTER — Ambulatory Visit (HOSPITAL_COMMUNITY)
Admission: RE | Admit: 2024-02-27 | Discharge: 2024-02-27 | Disposition: A | Discharge status: Home / Self Care | Attending: Specialist | Admitting: Specialist

## 2024-02-27 ENCOUNTER — Encounter (HOSPITAL_COMMUNITY): Payer: Self-pay | Admitting: Specialist

## 2024-02-27 ENCOUNTER — Ambulatory Visit (HOSPITAL_BASED_OUTPATIENT_CLINIC_OR_DEPARTMENT_OTHER): Admitting: Anesthesiology

## 2024-02-27 ENCOUNTER — Ambulatory Visit (HOSPITAL_COMMUNITY): Payer: Self-pay | Admitting: Physician Assistant

## 2024-02-27 ENCOUNTER — Encounter (HOSPITAL_COMMUNITY)
Admission: RE | Disposition: A | Discharge status: Home / Self Care | Payer: Self-pay | Source: Home / Self Care | Attending: Specialist

## 2024-02-27 ENCOUNTER — Ambulatory Visit: Payer: Self-pay | Admitting: Orthopedic Surgery

## 2024-02-27 DIAGNOSIS — X58XXXA Exposure to other specified factors, initial encounter: Secondary | ICD-10-CM | POA: Diagnosis not present

## 2024-02-27 DIAGNOSIS — G4733 Obstructive sleep apnea (adult) (pediatric): Secondary | ICD-10-CM | POA: Diagnosis not present

## 2024-02-27 DIAGNOSIS — M1990 Unspecified osteoarthritis, unspecified site: Secondary | ICD-10-CM | POA: Diagnosis not present

## 2024-02-27 DIAGNOSIS — Z7984 Long term (current) use of oral hypoglycemic drugs: Secondary | ICD-10-CM | POA: Diagnosis not present

## 2024-02-27 DIAGNOSIS — M791 Myalgia, unspecified site: Secondary | ICD-10-CM | POA: Diagnosis not present

## 2024-02-27 DIAGNOSIS — I10 Essential (primary) hypertension: Secondary | ICD-10-CM | POA: Insufficient documentation

## 2024-02-27 DIAGNOSIS — E119 Type 2 diabetes mellitus without complications: Secondary | ICD-10-CM | POA: Diagnosis not present

## 2024-02-27 DIAGNOSIS — T8131XA Disruption of external operation (surgical) wound, not elsewhere classified, initial encounter: Secondary | ICD-10-CM | POA: Diagnosis not present

## 2024-02-27 DIAGNOSIS — T8189XA Other complications of procedures, not elsewhere classified, initial encounter: Secondary | ICD-10-CM | POA: Diagnosis not present

## 2024-02-27 DIAGNOSIS — Z87891 Personal history of nicotine dependence: Secondary | ICD-10-CM | POA: Insufficient documentation

## 2024-02-27 DIAGNOSIS — Z96651 Presence of right artificial knee joint: Secondary | ICD-10-CM | POA: Insufficient documentation

## 2024-02-27 DIAGNOSIS — G4730 Sleep apnea, unspecified: Secondary | ICD-10-CM | POA: Insufficient documentation

## 2024-02-27 DIAGNOSIS — F32A Depression, unspecified: Secondary | ICD-10-CM | POA: Insufficient documentation

## 2024-02-27 HISTORY — PX: INCISION AND DRAINAGE OF WOUND: SHX1803

## 2024-02-27 LAB — GLUCOSE, CAPILLARY
Glucose-Capillary: 90 mg/dL (ref 70–99)
Glucose-Capillary: 82 mg/dL (ref 70–99)

## 2024-02-27 SURGERY — IRRIGATION AND DEBRIDEMENT WOUND
Anesthesia: General | Laterality: Right

## 2024-02-27 MED ORDER — ONDANSETRON HCL 4 MG/2ML IJ SOLN
INTRAMUSCULAR | Status: DC | PRN
  Administered 2024-02-27: 4 mg via INTRAVENOUS

## 2024-02-27 MED ORDER — DEXAMETHASONE SODIUM PHOSPHATE 10 MG/ML IJ SOLN
INTRAMUSCULAR | Status: DC | PRN
Start: 2024-02-27 — End: 2024-02-27
  Administered 2024-02-27: 4 mg via INTRAVENOUS

## 2024-02-27 MED ORDER — LIDOCAINE HCL (PF) 2 % IJ SOLN
INTRAMUSCULAR | Status: DC | PRN
Start: 2024-02-27 — End: 2024-02-27
  Administered 2024-02-27: 100 mg via INTRADERMAL

## 2024-02-27 MED ORDER — PROPOFOL 10 MG/ML IV BOLUS
INTRAVENOUS | Status: DC | PRN
  Administered 2024-02-27: 170 ug/kg/min via INTRAVENOUS

## 2024-02-27 MED ORDER — LIDOCAINE HCL (PF) 2 % IJ SOLN
INTRAMUSCULAR | Status: AC
  Filled 2024-02-27: qty 5

## 2024-02-27 MED ORDER — DROPERIDOL 2.5 MG/ML IJ SOLN: 0.6250 mg | Freq: Once | INTRAMUSCULAR | Status: DC | PRN

## 2024-02-27 MED ORDER — DEXAMETHASONE SODIUM PHOSPHATE 10 MG/ML IJ SOLN
INTRAMUSCULAR | Status: AC
Start: 2024-02-27 — End: ?
  Filled 2024-02-27: qty 1

## 2024-02-27 MED ORDER — FENTANYL CITRATE PF 50 MCG/ML IJ SOSY
25.0000 ug | PREFILLED_SYRINGE | INTRAMUSCULAR | Status: DC | PRN
  Administered 2024-02-27: 25 ug via INTRAVENOUS

## 2024-02-27 MED ORDER — FENTANYL CITRATE (PF) 100 MCG/2ML IJ SOLN
INTRAMUSCULAR | Status: AC
Start: 2024-02-27 — End: ?
  Filled 2024-02-27: qty 2

## 2024-02-27 MED ORDER — LACTATED RINGERS IV SOLN: INTRAVENOUS | Status: DC

## 2024-02-27 MED ORDER — OXYCODONE HCL 5 MG PO TABS
ORAL_TABLET | ORAL | Status: AC
  Filled 2024-02-27: qty 1

## 2024-02-27 MED ORDER — LACTATED RINGERS IV SOLN
INTRAVENOUS | Status: DC
Start: 2024-02-27 — End: 2024-02-27

## 2024-02-27 MED ORDER — PRONTOSAN WOUND IRRIGATION OPTIME
TOPICAL | Status: DC | PRN
  Administered 2024-02-27: 350 mL

## 2024-02-27 MED ORDER — BUPIVACAINE HCL 0.25 % IJ SOLN
INTRAMUSCULAR | Status: DC | PRN
  Administered 2024-02-27: 4 mL

## 2024-02-27 MED ORDER — FENTANYL CITRATE PF 50 MCG/ML IJ SOSY
PREFILLED_SYRINGE | INTRAMUSCULAR | Status: AC
  Filled 2024-02-27: qty 1

## 2024-02-27 MED ORDER — ONDANSETRON HCL 4 MG/2ML IJ SOLN
INTRAMUSCULAR | Status: AC
  Filled 2024-02-27: qty 2

## 2024-02-27 MED ORDER — BUPIVACAINE-EPINEPHRINE (PF) 0.25% -1:200000 IJ SOLN
INTRAMUSCULAR | Status: AC
  Filled 2024-02-27: qty 30

## 2024-02-27 MED ORDER — FENTANYL CITRATE (PF) 100 MCG/2ML IJ SOLN
INTRAMUSCULAR | Status: DC | PRN
  Administered 2024-02-27: 25 ug via INTRAVENOUS

## 2024-02-27 MED ORDER — OXYCODONE HCL 5 MG PO TABS
5.0000 mg | ORAL_TABLET | Freq: Once | ORAL | Status: AC | PRN
  Administered 2024-02-27: 5 mg via ORAL

## 2024-02-27 MED ORDER — BUPIVACAINE HCL (PF) 0.25 % IJ SOLN
INTRAMUSCULAR | Status: AC
  Filled 2024-02-27: qty 30

## 2024-02-27 MED ORDER — MIDAZOLAM HCL 2 MG/2ML IJ SOLN
INTRAMUSCULAR | Status: DC | PRN
  Administered 2024-02-27: 2 mg via INTRAVENOUS

## 2024-02-27 MED ORDER — ACETAMINOPHEN 500 MG PO TABS: 1000.0000 mg | ORAL_TABLET | Freq: Once | ORAL | Status: DC

## 2024-02-27 MED ORDER — LEVOFLOXACIN IN D5W 750 MG/150ML IV SOLN
500.0000 mg | INTRAVENOUS | Status: AC
Start: 2024-02-27 — End: 2024-02-28

## 2024-02-27 MED ORDER — INSULIN ASPART 100 UNIT/ML IJ SOLN: 0.0000 [IU] | INTRAMUSCULAR | Status: DC | PRN

## 2024-02-27 MED ORDER — ACETAMINOPHEN 10 MG/ML IV SOLN
1000.0000 mg | INTRAVENOUS | Status: AC
Start: 2024-02-27 — End: 2024-02-27
  Administered 2024-02-27: 1000 mg via INTRAVENOUS
  Filled 2024-02-27: qty 100

## 2024-02-27 MED ORDER — ORAL CARE MOUTH RINSE: 15.0000 mL | Freq: Once | OROMUCOSAL | Status: AC

## 2024-02-27 MED ORDER — 0.9 % SODIUM CHLORIDE (POUR BTL) OPTIME
TOPICAL | Status: DC | PRN
  Administered 2024-02-27: 1000 mL

## 2024-02-27 MED ORDER — LEVOFLOXACIN 500 MG PO TABS: 500.0000 mg | ORAL_TABLET | Freq: Every day | ORAL | 1 refills | Status: DC

## 2024-02-27 MED ORDER — MIDAZOLAM HCL 2 MG/2ML IJ SOLN
INTRAMUSCULAR | Status: AC
  Filled 2024-02-27: qty 2

## 2024-02-27 MED ORDER — OXYCODONE HCL 5 MG/5ML PO SOLN: 5.0000 mg | Freq: Once | ORAL | Status: AC | PRN

## 2024-02-27 MED ORDER — CHLORHEXIDINE GLUCONATE 0.12 % MT SOLN
15.0000 mL | Freq: Once | OROMUCOSAL | Status: AC
  Administered 2024-02-27: 15 mL via OROMUCOSAL

## 2024-02-27 SURGICAL SUPPLY — 44 items
BAG COUNTER SPONGE SURGICOUNT (BAG) IMPLANT
BAG ZIPLOCK 12X15 (MISCELLANEOUS) ×1 IMPLANT
BANDAGE ESMARK 6X9 LF (GAUZE/BANDAGES/DRESSINGS) ×1 IMPLANT
BNDG ELASTIC 4INX 5YD STR LF (GAUZE/BANDAGES/DRESSINGS) IMPLANT
BNDG ELASTIC 6INX 5YD STR LF (GAUZE/BANDAGES/DRESSINGS) IMPLANT
BNDG GAUZE DERMACEA FLUFF 4 (GAUZE/BANDAGES/DRESSINGS) ×1 IMPLANT
CNTNR URN SCR LID CUP LEK RST (MISCELLANEOUS) IMPLANT
COVER SURGICAL LIGHT HANDLE (MISCELLANEOUS) ×1 IMPLANT
CUFF TOURN SGL QUICK 18X4 (TOURNIQUET CUFF) IMPLANT
CUFF TRNQT CYL 24X4X16.5-23 (TOURNIQUET CUFF) IMPLANT
CUFF TRNQT CYL 34X4.125X (TOURNIQUET CUFF) IMPLANT
DRAIN PENROSE 0.5X18 (DRAIN) ×1 IMPLANT
DRSG EMULSION OIL 3X3 NADH (GAUZE/BANDAGES/DRESSINGS) IMPLANT
DURAPREP 26ML APPLICATOR (WOUND CARE) ×1 IMPLANT
ELECT REM PT RETURN 15FT ADLT (MISCELLANEOUS) ×1 IMPLANT
EVACUATOR 1/8 PVC DRAIN (DRAIN) ×1 IMPLANT
GAUZE PAD ABD 8X10 STRL (GAUZE/BANDAGES/DRESSINGS) ×2 IMPLANT
GAUZE SPONGE 4X4 12PLY STRL (GAUZE/BANDAGES/DRESSINGS) ×1 IMPLANT
GLOVE BIOGEL PI IND STRL 7.0 (GLOVE) ×1 IMPLANT
GLOVE BIOGEL PI IND STRL 8 (GLOVE) ×1 IMPLANT
GLOVE SURG SS PI 8.0 STRL IVOR (GLOVE) ×2 IMPLANT
GOWN STRL REUS W/ TWL XL LVL3 (GOWN DISPOSABLE) ×3 IMPLANT
IMMOBILIZER KNEE 20 (SOFTGOODS) ×1 IMPLANT
IMMOBILIZER KNEE 20 THIGH 36 (SOFTGOODS) IMPLANT
KIT BASIN OR (CUSTOM PROCEDURE TRAY) ×1 IMPLANT
KIT TURNOVER KIT A (KITS) IMPLANT
MANIFOLD NEPTUNE II (INSTRUMENTS) ×1 IMPLANT
NDL HYPO 22X1.5 SAFETY MO (MISCELLANEOUS) IMPLANT
NEEDLE HYPO 22X1.5 SAFETY MO (MISCELLANEOUS) ×1 IMPLANT
PACK ORTHO EXTREMITY (CUSTOM PROCEDURE TRAY) ×1 IMPLANT
PAD CAST 4YDX4 CTTN HI CHSV (CAST SUPPLIES) ×1 IMPLANT
PENCIL SMOKE EVACUATOR (MISCELLANEOUS) IMPLANT
PROTECTOR NERVE ULNAR (MISCELLANEOUS) ×1 IMPLANT
SET HNDPC FAN SPRY TIP SCT (DISPOSABLE) ×1 IMPLANT
SOLUTION PRONTOSAN WOUND 350ML (IRRIGATION / IRRIGATOR) IMPLANT
STAPLER SKIN PROX WIDE 3.9 (STAPLE) IMPLANT
STAPLER VISISTAT (STAPLE) IMPLANT
SUT VIC AB 0 CT1 36 (SUTURE) ×1 IMPLANT
SUT VIC AB 2-0 CT1 TAPERPNT 27 (SUTURE) IMPLANT
SUT VIC AB 2-0 CT2 27 (SUTURE) IMPLANT
SWAB COLLECTION DEVICE MRSA (MISCELLANEOUS) IMPLANT
SWAB CULTURE ESWAB REG 1ML (MISCELLANEOUS) IMPLANT
SYR CONTROL 10ML LL (SYRINGE) ×1 IMPLANT
WIPE CHG 2% PREP (PERSONAL CARE ITEMS) ×1 IMPLANT

## 2024-02-27 NOTE — Brief Op Note (Signed)
 02/27/2024  2:34 PM  PATIENT:  Jeffrey Banks  73 y.o. male  PRE-OPERATIVE DIAGNOSIS:  Non healing incision right knee  POST-OPERATIVE DIAGNOSIS:  Non healing incision right knee  PROCEDURE:  Procedure(s) with comments: IRRIGATION AND DEBRIDEMENT WOUND (Right) - Right knee revision of non healing total knee incision  SURGEON:  Surgeons and Role:    Orvan Blanch, MD - Primary  PHYSICIAN ASSISTANT:   ASSISTANTS: Bissell   ANESTHESIA:   general  EBL:  min   BLOOD ADMINISTERED:none  DRAINS: none   LOCAL MEDICATIONS USED:  MARCAINE      SPECIMEN:  No Specimen  DISPOSITION OF SPECIMEN:  N/A  COUNTS:  YES  TOURNIQUET:   DICTATION: .Other Dictation: Dictation Number [id number]  PLAN OF CARE: Discharge to home after PACU  PATIENT DISPOSITION:  PACU - hemodynamically stable.   Delay start of Pharmacological VTE agent (>24hrs) due to surgical blood loss or risk of bleeding: no

## 2024-02-27 NOTE — Anesthesia Procedure Notes (Signed)
 Procedure Name: LMA Insertion Date/Time: 02/27/2024 1:15 PM  Performed by: Norvell Beers, CRNAPre-anesthesia Checklist: Patient identified, Emergency Drugs available, Suction available and Patient being monitored Patient Re-evaluated:Patient Re-evaluated prior to induction Oxygen Delivery Method: Circle system utilized Preoxygenation: Pre-oxygenation with 100% oxygen Induction Type: IV induction LMA: LMA inserted LMA Size: 4.0 Number of attempts: 1 Placement Confirmation: positive ETCO2 Tube secured with: Tape

## 2024-02-27 NOTE — Anesthesia Preprocedure Evaluation (Addendum)
 Anesthesia Evaluation  Patient identified by MRN, date of birth, ID band Patient awake    Reviewed: Allergy & Precautions, NPO status , Patient's Chart, lab work & pertinent test results, reviewed documented beta blocker date and time   History of Anesthesia Complications Negative for: history of anesthetic complications  Airway Mallampati: II  TM Distance: >3 FB Neck ROM: Full    Dental no notable dental hx. (+)    Pulmonary sleep apnea and Continuous Positive Airway Pressure Ventilation , former smoker   Pulmonary exam normal        Cardiovascular hypertension, Pt. on home beta blockers and Pt. on medications Normal cardiovascular exam     Neuro/Psych    Depression       GI/Hepatic negative GI ROS, Neg liver ROS,,,  Endo/Other  diabetes, Type 2, Oral Hypoglycemic Agents  BMI 38  Renal/GU negative Renal ROS     Musculoskeletal  (+) Arthritis ,    Abdominal   Peds  Hematology negative hematology ROS (+)   Anesthesia Other Findings Non healing incision right knee  Reproductive/Obstetrics                              Anesthesia Physical Anesthesia Plan  ASA: 2  Anesthesia Plan: General   Post-op Pain Management: Tylenol  PO (pre-op)*   Induction: Intravenous  PONV Risk Score and Plan: 2 and Treatment may vary due to age or medical condition, Ondansetron  and Dexamethasone   Airway Management Planned: LMA  Additional Equipment: None  Intra-op Plan:   Post-operative Plan: Extubation in OR  Informed Consent: I have reviewed the patients History and Physical, chart, labs and discussed the procedure including the risks, benefits and alternatives for the proposed anesthesia with the patient or authorized representative who has indicated his/her understanding and acceptance.     Dental advisory given  Plan Discussed with: CRNA  Anesthesia Plan Comments:          Anesthesia  Quick Evaluation

## 2024-02-27 NOTE — Anesthesia Postprocedure Evaluation (Signed)
 Anesthesia Post Note  Patient: Jeffrey Banks  Procedure(s) Performed: IRRIGATION AND DEBRIDEMENT WOUND (Right)     Patient location during evaluation: PACU Anesthesia Type: General Level of consciousness: awake and alert Pain management: pain level controlled Vital Signs Assessment: post-procedure vital signs reviewed and stable Respiratory status: spontaneous breathing, nonlabored ventilation and respiratory function stable Cardiovascular status: blood pressure returned to baseline Postop Assessment: no apparent nausea or vomiting Anesthetic complications: no   No notable events documented.  Last Vitals:  Vitals:   02/27/24 1529 02/27/24 1530  BP: (!) 147/70 (!) 147/70  Pulse: 69 71  Resp: 20 12  Temp:    SpO2: 100% 99%    Last Pain:  Vitals:   02/27/24 1536  TempSrc:   PainSc: 3                  Rayfield Cairo

## 2024-02-27 NOTE — Transfer of Care (Signed)
 Immediate Anesthesia Transfer of Care Note  Patient: Jeffrey Banks  Procedure(s) Performed: IRRIGATION AND DEBRIDEMENT WOUND (Right)  Patient Location: PACU  Anesthesia Type:General  Level of Consciousness: awake, alert , and oriented  Airway & Oxygen Therapy: Patient Spontanous Breathing and Patient connected to face mask oxygen  Post-op Assessment: Report given to RN and Post -op Vital signs reviewed and stable  Post vital signs: Reviewed and stable  Last Vitals:  Vitals Value Taken Time  BP 161/65   Temp    Pulse 77 02/27/24 1450  Resp 10 02/27/24 1450  SpO2 100 % 02/27/24 1450  Vitals shown include unfiled device data.  Last Pain:  Vitals:   02/27/24 1120  TempSrc: Oral  PainSc:          Complications: No notable events documented.

## 2024-02-27 NOTE — Interval H&P Note (Signed)
 History and Physical Interval Note:  02/27/2024 1:14 PM  Jeffrey Banks  has presented today for surgery, with the diagnosis of Non healing incision right knee.  The various methods of treatment have been discussed with the patient and family. After consideration of risks, benefits and other options for treatment, the patient has consented to  Procedure(s) with comments: IRRIGATION AND DEBRIDEMENT WOUND (Right) - Right knee revision of non healing total knee incision as a surgical intervention.  The patient's history has been reviewed, patient examined, no change in status, stable for surgery.  I have reviewed the patient's chart and labs.  Questions were answered to the patient's satisfaction.     Loel Ring

## 2024-02-28 ENCOUNTER — Encounter (HOSPITAL_COMMUNITY): Payer: Self-pay | Admitting: Specialist

## 2024-02-28 NOTE — Op Note (Signed)
 Jeffrey Banks, [name] MEDICAL RECORD NO: [id number] ACCOUNT NO: [id number] DATE OF BIRTH: 02-20-1951 FACILITY: [facility] LOCATION: [facility] PHYSICIAN: Loel Ring, MD  Operative Report   DATE OF PROCEDURE: 02/27/2024  PREOPERATIVE DIAGNOSIS:  Nonhealing total knee incision distally 1x1 cm.  POSTOPERATIVE DIAGNOSIS: same.  PROCEDURE PERFORMED:  Excision of nonhealing distal wound full thickness 1x1 cm of a total knee incision and I and D.  ANESTHESIA: General.  ASSISTANT:   Jaclyn Bissell, PA.  HISTORY:  This is a 73 year old male who is status post total knee replacement.  He has had for the past 3-4 weeks, a portion of the distal wound that was nonhealing. It was 1 x 1 cm initially, granulating well.  He was prophylactically placed on  doxycycline, had serosanguineous drainage that decreased with elevation and edema control. There was a small area of devitalized skin, however, that remained unhealed and persistent.  He had clear serosanguineous drainage that we cultured and obtained a  serratia and a staph organism that were sensitive to ciprofloxacin and levofloxacin.  A thought was perhaps a contaminate or colonized with this as he had no gross purulence of the wound or any evidence of abscess, etc. We discussed the risks and  benefits including bleeding, infection, inability to close the wound, DVT, PE, anesthetic complications, etc.  TECHNIQUE OF PROCEDURE: The patient was placed in supine position.  After induction of adequate general anesthesia and 500 IV of levofloxacin, his right lower extremity was prepped and draped in the usual sterile fashion.  We excised the nonhealing skin  that extended into the subcutaneous and adipose later.  It was approximately 1.5 cm in width.  We ellipsed it with the ratio of 3:1.  We carried the dissection sharply down to the subcutaneous tissue. With a small curette, we debrided the skin edges to  good bleeding and granulating tissue, which was noted  over the tibia.  We slightly undermined the full-thickness flaps approximately 1.o cm on either side and mobilized the medial and lateral flaps to excellent approximation without tension.  We  copiously irrigated the wound with Prontosan.  Again, no evidence of active infection. No purulence. Good bleeding tissue was noted no tension. Following this, we closed the subQ with 2-0 Vicryl interrupted sutures with approximation of the wound and   proximally and distally where we extended the incision to complete our 3:1 ratio.  We approximated with a small stapler where the incision was extended then In the region of the excised skin, we used a large stapler to guard against any tension to this part of the wound. It was closed without tension.   I then Copiously irrigated with Prontosan and then covered it with 4 x 4's and a sterile dressing. We ranged the knee at 0-115 degrees, no instability, no evidence of effusion.  He was extubated and transported to the recovery room in satisfactory condition.  The patient tolerated the procedure well. No complications.  Assistant, Jaclyn Bissell, [place], was used to hold the mobilization of the skin to allow for suture and closure. Minimal blood loss.    NIK D: 02/27/2024 2:40:59 pm T: 02/28/2024 2:54:00 am  JOB: [id number]/ [id number]

## 2024-03-02 ENCOUNTER — Encounter (HOSPITAL_BASED_OUTPATIENT_CLINIC_OR_DEPARTMENT_OTHER): Payer: Self-pay | Admitting: Emergency Medicine

## 2024-03-02 ENCOUNTER — Emergency Department (HOSPITAL_BASED_OUTPATIENT_CLINIC_OR_DEPARTMENT_OTHER)
Admission: EM | Admit: 2024-03-02 | Discharge: 2024-03-02 | Disposition: A | Discharge status: Home / Self Care | Attending: Emergency Medicine | Admitting: Emergency Medicine

## 2024-03-02 ENCOUNTER — Other Ambulatory Visit: Payer: Self-pay

## 2024-03-02 DIAGNOSIS — K5641 Fecal impaction: Secondary | ICD-10-CM | POA: Diagnosis not present

## 2024-03-02 DIAGNOSIS — K5900 Constipation, unspecified: Secondary | ICD-10-CM | POA: Diagnosis present

## 2024-03-02 MED ORDER — FLEET ENEMA RE ENEM
1.0000 | ENEMA | Freq: Once | RECTAL | Status: AC
  Administered 2024-03-02: 1 via RECTAL
  Filled 2024-03-02: qty 1

## 2024-03-02 NOTE — ED Notes (Signed)
 Patient reports positive results from enema.  Abdominal tenderness has resolved

## 2024-03-02 NOTE — ED Provider Notes (Signed)
 Patient successfully disimpacted by Dr. Bolivar Bushman.  Plan was to follow-up with CT scan and labs to rule out any significant intra-abdominal pathology. Physical Exam  BP (!) 169/72 (BP Location: Right Arm)   Pulse 89   Temp 98.2 F (36.8 C) (Oral)   Resp 16   Ht 5' 8.5" (1.74 m)   Wt 113.9 kg   SpO2 96%   BMI 37.61 kg/m   Physical Exam  Procedures  Procedures  ED Course / MDM   Clinical Course as of 03/02/24 0710  Sun Mar 02, 2024  0658 Patient had large BM x 3 after enema and reports significant improvement. Still some discomfort in LLQ, he is amenable to proceeding with labs and CT. Care will be signed out to oncoming team at the change of shift.  [CS]    Clinical Course User Index [CS] Charmayne Cooper, MD   Medical Decision Making Amount and/or Complexity of Data Reviewed Labs: ordered. Radiology: ordered.  Risk OTC drugs.   Patient reports that he feels completely improved.  He is "a new man".  At this time he wishes to forego CT and lab work.  Advises that he will follow-up with PCP.       Wynetta Heckle, MD 03/02/24 [phone]

## 2024-03-02 NOTE — ED Notes (Signed)
 Patient instructed to retain enema for as long as he was able to.  Pt currently sitting on toilet.

## 2024-03-02 NOTE — ED Provider Notes (Signed)
 [place] EMERGENCY DEPARTMENT AT [facility]  Provider Note  CSN: [id number] Arrival date & time: 03/02/24 0600  History Chief Complaint  Patient presents with   Constipation    Jeffrey Banks is a 73 y.o. male who recently had a wound debridement to R knee for a poorly healing wound from TKR in March. He reports since the surgery four days ago, he has not been able to have a bowel movement. He reports he feels like there is stool at his rectum that will not come out regardless of how hard he strains and now he is having some nausea and LLQ abdominal pain. Denies fever. Noticed a small amount of blood after trying to digitally disimpact himself.    Home Medications Prior to Admission medications   Medication Sig Start Date End Date Taking? Authorizing Provider  acetaminophen  (TYLENOL ) 500 MG tablet Take 500-1,000 mg by mouth every 6 (six) hours as needed (pain.).    [provider]  aspirin  EC 81 MG tablet Take 1 tablet (81 mg total) by mouth 2 (two) times daily after a meal. Day after surgery Patient not taking: Reported on 02/21/2024 12/28/23   Bissell, Jaclyn M, PA-C  docusate sodium  (COLACE) 100 MG capsule Take 1 capsule (100 mg total) by mouth 2 (two) times daily. Patient taking differently: Take 100 mg by mouth daily as needed for moderate constipation. 12/28/23 12/27/24  Bissell, Jaclyn M, PA-C  Evolocumab (REPATHA SURECLICK) 140 MG/ML SOAJ Inject 140 mg into the skin every 14 (fourteen) days. Wednesdays.    [provider]  fexofenadine (ALLEGRA) 180 MG tablet Take 180 mg by mouth daily.    [provider]  levofloxacin  (LEVAQUIN ) 500 MG tablet Take 1 tablet (500 mg total) by mouth daily. 02/27/24   Orvan Blanch, MD  methocarbamol  (ROBAXIN ) 500 MG tablet Take 1 tablet (500 mg total) by mouth every 8 (eight) hours as needed for muscle spasms. Patient not taking: Reported on 02/21/2024 12/28/23   Bissell, Jaclyn M, PA-C  pioglitazone (ACTOS) 15 MG  tablet Take 15 mg by mouth in the morning.    [provider]  polyethylene glycol (MIRALAX  / GLYCOLAX ) 17 g packet Take 17 g by mouth daily. Patient taking differently: Take 17 g by mouth daily as needed for moderate constipation. 12/28/23   Bissell, Jaclyn M, PA-C  propranolol  (INDERAL ) 10 MG tablet Take 10 mg by mouth 2 (two) times daily as needed (anxiety).    [provider]     Allergies    Tape   Review of Systems   Review of Systems Please see HPI for pertinent positives and negatives  Physical Exam BP (!) 169/72 (BP Location: Right Arm)   Pulse 89   Temp 98.2 F (36.8 C) (Oral)   Resp 16   Ht 5' 8.5" (1.74 m)   Wt 113.9 kg   SpO2 96%   BMI 37.61 kg/m   Physical Exam Vitals and nursing note reviewed. Exam conducted with a chaperone present.  Constitutional:      Appearance: Normal appearance.  HENT:     Head: Normocephalic and atraumatic.     Nose: Nose normal.     Mouth/Throat:     Mouth: Mucous membranes are moist.  Eyes:     Extraocular Movements: Extraocular movements intact.     Conjunctiva/sclera: Conjunctivae normal.  Cardiovascular:     Rate and Rhythm: Normal rate.  Pulmonary:     Effort: Pulmonary effort is normal.  Breath sounds: Normal breath sounds.  Abdominal:     General: Abdomen is flat.     Palpations: Abdomen is soft.     Tenderness: There is abdominal tenderness (LLQ). There is no guarding.  Genitourinary:    Comments: Several non thrombosed, non bleeding hemorrhoids; on digital exam there is a stool ball high in rectum which was broken up digitally Musculoskeletal:        General: No swelling. Normal range of motion.     Cervical back: Neck supple.  Skin:    General: Skin is warm and dry.  Neurological:     General: No focal deficit present.     Mental Status: He is alert.  Psychiatric:        Mood and Affect: Mood normal.     ED Results / Procedures / Treatments    EKG None  Procedures Procedures  Medications Ordered in the ED Medications  sodium phosphate  (FLEET) enema 1 enema (1 enema Rectal Given 03/02/24 0636)    Initial Impression and Plan  Patient here with fecal impaction also having LLQ pain/tenderness. Stool ball was too high up for manual disimpaction, but I was able to break it into smaller pieces. Will give an enema to help with disimpaction. Given LLQ pain will check labs and send for CT.   ED Course   Clinical Course as of 03/02/24 0701  Sun Mar 02, 2024  0658 Patient had large BM x 3 after enema and reports significant improvement. Still some discomfort in LLQ, he is amenable to proceeding with labs and CT. Care will be signed out to oncoming team at the change of shift.  [CS]    Clinical Course User Index [CS] Charmayne Cooper, MD     MDM Rules/Calculators/A&P Medical Decision Making Problems Addressed: Fecal impaction [facility]): acute illness or injury  Amount and/or Complexity of Data Reviewed Labs: ordered. Radiology: ordered.  Risk OTC drugs.     Final Clinical Impression(s) / ED Diagnoses Final diagnoses:  Fecal impaction [facility])    Rx / DC Orders ED Discharge Orders     None        Charmayne Cooper, MD 03/02/24 [phone]

## 2024-03-02 NOTE — ED Triage Notes (Signed)
 Patient coming to ED for evaluation of constipation and abdominal pain.  Reports he had surgery on Wednesday.  Has not had a BM since surgery.  Tried OTC medication without relief.  States "it feels like a ball is there and it want come out."  Starting to have abdominal pain due to constipation.

## 2024-03-21 DIAGNOSIS — M25661 Stiffness of right knee, not elsewhere classified: Secondary | ICD-10-CM | POA: Diagnosis not present

## 2024-03-21 DIAGNOSIS — M25561 Pain in right knee: Secondary | ICD-10-CM | POA: Diagnosis not present

## 2024-04-14 DIAGNOSIS — M25661 Stiffness of right knee, not elsewhere classified: Secondary | ICD-10-CM | POA: Diagnosis not present

## 2024-04-14 DIAGNOSIS — M25561 Pain in right knee: Secondary | ICD-10-CM | POA: Diagnosis not present

## 2024-05-13 DIAGNOSIS — R972 Elevated prostate specific antigen [PSA]: Secondary | ICD-10-CM | POA: Diagnosis not present

## 2024-05-13 DIAGNOSIS — C61 Malignant neoplasm of prostate: Secondary | ICD-10-CM | POA: Diagnosis not present

## 2024-05-13 DIAGNOSIS — R350 Frequency of micturition: Secondary | ICD-10-CM | POA: Diagnosis not present

## 2024-05-21 DIAGNOSIS — C61 Malignant neoplasm of prostate: Secondary | ICD-10-CM | POA: Diagnosis not present

## 2024-05-21 DIAGNOSIS — R350 Frequency of micturition: Secondary | ICD-10-CM | POA: Diagnosis not present

## 2024-05-21 DIAGNOSIS — N401 Enlarged prostate with lower urinary tract symptoms: Secondary | ICD-10-CM | POA: Diagnosis not present

## 2024-05-23 DIAGNOSIS — M545 Low back pain, unspecified: Secondary | ICD-10-CM | POA: Diagnosis not present

## 2024-05-23 DIAGNOSIS — Z4889 Encounter for other specified surgical aftercare: Secondary | ICD-10-CM | POA: Diagnosis not present

## 2024-05-23 DIAGNOSIS — Z96651 Presence of right artificial knee joint: Secondary | ICD-10-CM | POA: Diagnosis not present

## 2024-05-26 ENCOUNTER — Other Ambulatory Visit (HOSPITAL_COMMUNITY): Payer: Self-pay | Admitting: Urology

## 2024-05-26 DIAGNOSIS — C61 Malignant neoplasm of prostate: Secondary | ICD-10-CM

## 2024-05-30 ENCOUNTER — Encounter (HOSPITAL_COMMUNITY)
Admission: RE | Admit: 2024-05-30 | Discharge: 2024-05-30 | Disposition: A | Discharge status: Home / Self Care | Source: Ambulatory Visit | Attending: Urology

## 2024-05-30 DIAGNOSIS — C61 Malignant neoplasm of prostate: Secondary | ICD-10-CM | POA: Diagnosis not present

## 2024-05-30 MED ORDER — FLOTUFOLASTAT F 18 GALLIUM 296-5846 MBQ/ML IV SOLN
8.0000 | Freq: Once | INTRAVENOUS | Status: AC
  Administered 2024-05-30: 8 via INTRAVENOUS

## 2024-06-06 DIAGNOSIS — L821 Other seborrheic keratosis: Secondary | ICD-10-CM | POA: Diagnosis not present

## 2024-06-06 DIAGNOSIS — L309 Dermatitis, unspecified: Secondary | ICD-10-CM | POA: Diagnosis not present

## 2024-06-06 DIAGNOSIS — L578 Other skin changes due to chronic exposure to nonionizing radiation: Secondary | ICD-10-CM | POA: Diagnosis not present

## 2024-06-06 DIAGNOSIS — D225 Melanocytic nevi of trunk: Secondary | ICD-10-CM | POA: Diagnosis not present

## 2024-06-06 DIAGNOSIS — L570 Actinic keratosis: Secondary | ICD-10-CM | POA: Diagnosis not present

## 2024-06-06 DIAGNOSIS — L720 Epidermal cyst: Secondary | ICD-10-CM | POA: Diagnosis not present

## 2024-06-10 NOTE — Progress Notes (Incomplete)
 GU Location of Tumor / Histology: Prostate Ca  If Prostate Cancer, Gleason Score is (4 + 4) and PSA is (4.95 on 03/18/2024)  [name] presented as referral from [name]  Biopsies      05/30/2024 [name] NM PET (PSMA) Skull to Mid Thigh CLINICAL DATA: Prostate cancer. Positive biopsy   IMPRESSION: 1. Intense radiotracer activity in the posterior RIGHT lobe of the prostate gland consistent with primary prostate adenocarcinoma. 2. No evidence of metastatic adenopathy in the pelvis or periaortic retroperitoneum. 3. No evidence of visceral metastasis or skeletal metastasis.    Past/Anticipated interventions by urology, if any:  [name]   Past/Anticipated interventions by medical oncology, if any:  NA  Weight changes, if any: {:18581}  IPSS: SHIM:  Bowel/Bladder complaints, if any: {:18581}   Nausea/Vomiting, if any: {:18581}  Pain issues, if any:  {:18581}  SAFETY ISSUES: Prior radiation? {:18581} Pacemaker/ICD? {:18581} Possible current pregnancy? Male Is the patient on methotrexate? No  Current Complaints / other details:

## 2024-06-12 ENCOUNTER — Encounter: Payer: Self-pay | Admitting: Urology

## 2024-06-12 DIAGNOSIS — C61 Malignant neoplasm of prostate: Secondary | ICD-10-CM | POA: Insufficient documentation

## 2024-06-13 ENCOUNTER — Encounter: Payer: Self-pay | Admitting: Radiation Oncology

## 2024-06-13 ENCOUNTER — Ambulatory Visit
Admission: RE | Admit: 2024-06-13 | Discharge: 2024-06-13 | Disposition: A | Discharge status: Home / Self Care | Source: Ambulatory Visit | Attending: Radiation Oncology | Admitting: Radiation Oncology

## 2024-06-13 VITALS — BP 169/88 | HR 68 | Temp 97.9°F | Resp 20 | Ht 68.5 in | Wt 264.4 lb

## 2024-06-13 DIAGNOSIS — Z191 Hormone sensitive malignancy status: Secondary | ICD-10-CM | POA: Diagnosis not present

## 2024-06-13 DIAGNOSIS — C61 Malignant neoplasm of prostate: Secondary | ICD-10-CM | POA: Diagnosis not present

## 2024-06-13 DIAGNOSIS — L720 Epidermal cyst: Secondary | ICD-10-CM | POA: Insufficient documentation

## 2024-06-13 DIAGNOSIS — E119 Type 2 diabetes mellitus without complications: Secondary | ICD-10-CM | POA: Insufficient documentation

## 2024-06-13 DIAGNOSIS — E785 Hyperlipidemia, unspecified: Secondary | ICD-10-CM | POA: Insufficient documentation

## 2024-06-13 DIAGNOSIS — G4730 Sleep apnea, unspecified: Secondary | ICD-10-CM | POA: Insufficient documentation

## 2024-06-13 DIAGNOSIS — I2510 Atherosclerotic heart disease of native coronary artery without angina pectoris: Secondary | ICD-10-CM | POA: Insufficient documentation

## 2024-06-13 DIAGNOSIS — I10 Essential (primary) hypertension: Secondary | ICD-10-CM | POA: Diagnosis not present

## 2024-06-13 DIAGNOSIS — D2239 Melanocytic nevi of other parts of face: Secondary | ICD-10-CM | POA: Insufficient documentation

## 2024-06-13 DIAGNOSIS — L821 Other seborrheic keratosis: Secondary | ICD-10-CM | POA: Insufficient documentation

## 2024-06-13 DIAGNOSIS — M129 Arthropathy, unspecified: Secondary | ICD-10-CM | POA: Insufficient documentation

## 2024-06-13 DIAGNOSIS — I700 Atherosclerosis of aorta: Secondary | ICD-10-CM | POA: Insufficient documentation

## 2024-06-13 DIAGNOSIS — Z87891 Personal history of nicotine dependence: Secondary | ICD-10-CM | POA: Insufficient documentation

## 2024-06-13 DIAGNOSIS — D225 Melanocytic nevi of trunk: Secondary | ICD-10-CM | POA: Insufficient documentation

## 2024-06-13 HISTORY — DX: Elevated prostate specific antigen (PSA): R97.20

## 2024-06-13 NOTE — Progress Notes (Signed)
 Introduced myself to the patient, his wife, and his son, as the prostate nurse navigator.  No barriers to care identified at this time.  He is here to discuss his radiation treatment options.  I gave him my business card and asked him to call me with questions or concerns.  Verbalized understanding.

## 2024-06-13 NOTE — Progress Notes (Signed)
 Radiation Oncology         [phone] ________________________________  Initial Outpatient Consultation  Name: Jeffrey Banks MRN: [id number]  Date: 06/13/2024  DOB: 06-23-1951  RR:Ejuzmdnw, Toribio MATSU, MD  Jeffrey Donnice SAUNDERS, MD   REFERRING PHYSICIAN: Selma Donnice SAUNDERS, MD  DIAGNOSIS: 73 y.o. 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    ICD-10-CM   1. Malignant neoplasm of prostate (HCC)  C61       HISTORY OF PRESENT ILLNESS: Jeffrey Banks is a 74 y.o. male with a diagnosis of prostate cancer. He was noted to have an elevated PSA of 6.7 by his primary care physician, Dr. Yolande.  Accordingly, he was referred for evaluation in urology by Dr. Selma on 03/18/24,  digital rectal examination performed at that time showed a nodule on the right aspect of the prostate. A repeat PSA obtained that day showed a drop but persistent elevation at 4.95. The patient proceeded to transrectal ultrasound with 12 biopsies of the prostate on 05/13/24.  The prostate volume measured 49 cc.  Out of 12 core biopsies, all 12 were positive.  The maximum Gleason score was 4+4, and this was seen in right base lateral (with perineural invasion). Additionally, Gleason 3+4 was seen in the remaining right-sided cores, and Gleason 3+3 in all six left-sided cores.  He underwent a staging PSMA PET scan on 05/30/24 showing no evidence of disease outside of the prostate. There is a dominant intraprostatic nodule in the right lobe of the prostate, correlating with DRE and biopsy findings. There is also evidence of coronary artery calcifications and calcifications within the aorta which may increase his risks of cardiac events so we will share this information with his PCP so that he can implement any necessary measures to decrease cardiac risk.   The patient reviewed the biopsy and imaging results with his urologist and he has kindly been referred today for discussion of potential  radiation treatment options. He is accompanied by his wife and son for today's visit.   PREVIOUS RADIATION THERAPY: No  PAST MEDICAL HISTORY:  Past Medical History:  Diagnosis Date   Arthritis    Back pain 10/17/2007   Cough    Depression    Diabetes mellitus without complication (HCC)    borderline    Elevated PSA    Hyperlipidemia    Hypertension    Impaired fasting glucose 10/16/2012   Neuromuscular disorder (HCC)    neuropathy feet   OSA (obstructive sleep apnea) 10/16/2004   on CPAP does not know settings    Rhinitis    Stenosis of cervical spine       PAST SURGICAL HISTORY: Past Surgical History:  Procedure Laterality Date   bilateral cataract surgery      with lens implants    INCISION AND DRAINAGE OF WOUND Right 02/27/2024   Procedure: IRRIGATION AND DEBRIDEMENT WOUND;  Surgeon: Duwayne Purchase, MD;  Location: [facility];  Service: Orthopedics;  Laterality: Right;  Right knee revision of non healing total knee incision   lamenectomy  10/16/2008   PROSTATE BIOPSY     TONSILLECTOMY  10/17/1967   TOTAL KNEE ARTHROPLASTY Left 05/06/2015   Procedure: LEFT TOTAL KNEE ARTHROPLASTY;  Surgeon: Purchase Duwayne, MD;  Location: [facility];  Service: Orthopedics;  Laterality: Left;   TOTAL KNEE ARTHROPLASTY Right 12/26/2023   Procedure: ARTHROPLASTY, KNEE, TOTAL;  Surgeon: Duwayne Purchase, MD;  Location: [facility];  Service: Orthopedics;  Laterality:  Right;    FAMILY HISTORY:  Family History  Problem Relation Age of Onset   High Cholesterol Mother    Heart failure Father     SOCIAL HISTORY:  Social History   Socioeconomic History   Marital status: Married    Spouse name: Lonell   Number of children: 2   Years of education: college   Highest education level: Not on file  Occupational History    Comment: Health and safety inspector  Tobacco Use   Smoking status: Former    Types: Cigarettes   Smokeless tobacco: Former   Tobacco comments:    Quit 40 years ago  Advertising account planner   Vaping  status: Never Used  Substance and Sexual Activity   Alcohol  use: No    Alcohol /week: 0.0 standard drinks of alcohol    Drug use: No   Sexual activity: Not Currently  Other Topics Concern   Not on file  Social History Narrative   Consumes 2-3 cups of caffeine daily   Social Drivers of Corporate investment banker Strain: Not on file  Food Insecurity: No Food Insecurity (06/13/2024)   Hunger Vital Sign    Worried About Running Out of Food in the Last Year: Never true    Ran Out of Food in the Last Year: Never true  Transportation Needs: No Transportation Needs (06/13/2024)   PRAPARE - Administrator, Civil Service (Medical): No    Lack of Transportation (Non-Medical): No  Physical Activity: Not on file  Stress: Not on file  Social Connections: Unknown (12/26/2023)   Social Connection and Isolation Panel    Frequency of Communication with Friends and Family: More than three times a week    Frequency of Social Gatherings with Friends and Family: Three times a week    Attends Religious Services: Patient declined    Active Member of Clubs or Organizations: Patient declined    Attends Banker Meetings: More than 4 times per year    Marital Status: Married  Catering manager Violence: Not At Risk (06/13/2024)   Humiliation, Afraid, Rape, and Kick questionnaire    Fear of Current or Ex-Partner: No    Emotionally Abused: No    Physically Abused: No    Sexually Abused: No    ALLERGIES: Pravastatin, Tirzepatide, and Tape  MEDICATIONS:  Current Outpatient Medications  Medication Sig Dispense Refill   desonide (DESOWEN) 0.05 % cream Apply 1 Application topically 2 (two) times daily.     senna-docusate (SENOKOT-S) 8.6-50 MG tablet 1 tablet as needed Orally Twice a day for 30 days As needed constipation     acetaminophen  (TYLENOL ) 500 MG tablet Take 500-1,000 mg by mouth every 6 (six) hours as needed (pain.).     Evolocumab (REPATHA SURECLICK) 140 MG/ML SOAJ Inject  140 mg into the skin every 14 (fourteen) days. Wednesdays.     fexofenadine (ALLEGRA) 180 MG tablet Take 180 mg by mouth daily.     pioglitazone (ACTOS) 15 MG tablet Take 15 mg by mouth in the morning.     propranolol  (INDERAL ) 10 MG tablet Take 10 mg by mouth 2 (two) times daily as needed (anxiety).     No current facility-administered medications for this encounter.    REVIEW OF SYSTEMS:  On review of systems, the patient reports that he is doing well overall. He denies any chest pain, shortness of breath, cough, fevers, chills, night sweats, unintended weight changes. He denies any bowel disturbances, and denies abdominal pain, nausea or vomiting.  He denies any new musculoskeletal or joint aches or pains. His IPSS was 8, indicating mild urinary symptoms. His SHIM was 7, indicating he has moderate-severe erectile dysfunction. A complete review of systems is obtained and is otherwise negative.    PHYSICAL EXAM:  Wt Readings from Last 3 Encounters:  06/13/24 264 lb 6.4 oz (119.9 kg)  03/02/24 251 lb (113.9 kg)  02/27/24 251 lb (113.9 kg)   Temp Readings from Last 3 Encounters:  06/13/24 97.9 F (36.6 C)  03/02/24 98.2 F (36.8 C) (Oral)  02/27/24 97.6 F (36.4 C) ((P) Oral)   BP Readings from Last 3 Encounters:  06/13/24 (!) 169/88  03/02/24 (!) 169/72  02/27/24 (!) 155/70   Pulse Readings from Last 3 Encounters:  06/13/24 68  03/02/24 89  02/27/24 67   Pain Assessment Pain Score: 0-No pain/10  In general this is a well appearing Caucasian male in no acute distress. He's alert and oriented x4 and appropriate throughout the examination. Cardiopulmonary assessment is negative for acute distress, and he exhibits normal effort.     KPS = 100  100 - Normal; no complaints; no evidence of disease. 90   - Able to carry on normal activity; minor signs or symptoms of disease. 80   - Normal activity with effort; some signs or symptoms of disease. 24   - Cares for self; unable to  carry on normal activity or to do active work. 60   - Requires occasional assistance, but is able to care for most of his personal needs. 50   - Requires considerable assistance and frequent medical care. 40   - Disabled; requires special care and assistance. 30   - Severely disabled; hospital admission is indicated although death not imminent. 20   - Very sick; hospital admission necessary; active supportive treatment necessary. 10   - Moribund; fatal processes progressing rapidly. 0     - Dead  Karnofsky DA, Abelmann WH, Craver LS and Burchenal [facility] [phone]) The use of the nitrogen mustards in the palliative treatment of carcinoma: with particular reference to bronchogenic carcinoma Cancer 1 634-56  LABORATORY DATA:  Lab Results  Component Value Date   WBC 4.5 02/26/2024   HGB 13.0 02/26/2024   HCT 42.3 02/26/2024   MCV 91.4 02/26/2024   PLT 266 02/26/2024   Lab Results  Component Value Date   NA 135 02/26/2024   K 4.2 02/26/2024   CL 103 02/26/2024   CO2 26 02/26/2024   No results found for: ALT, AST, GGT, ALKPHOS, BILITOT   RADIOGRAPHY: NM PET (PSMA) SKULL TO MID THIGH Result Date: 06/09/2024 CLINICAL DATA:  Prostate cancer.  Positive biopsy EXAM: NUCLEAR MEDICINE PET SKULL BASE TO THIGH TECHNIQUE: 8.0 mCi Flotufolastat (Posluma ) was injected intravenously. Full-ring PET imaging was performed from the skull base to thigh after the radiotracer. CT data was obtained and used for attenuation correction and anatomic localization. COMPARISON:  None Available. FINDINGS: NECK No radiotracer activity in neck lymph nodes. Incidental CT finding: None. CHEST No radiotracer accumulation within mediastinal or hilar lymph nodes. No suspicious pulmonary nodules on the CT scan. Incidental CT finding: None. ABDOMEN/PELVIS Prostate: Intense radiotracer activity in the posterior RIGHT lobe of the prostate gland SUV max of 9.3 (image 195). Lymph nodes: No abnormal radiotracer accumulation within  pelvic or abdominal nodes. Liver: No evidence of liver metastasis. Incidental CT finding: None. SKELETON No focal activity to suggest skeletal metastasis. IMPRESSION: 1. Intense radiotracer activity in the posterior RIGHT lobe of the prostate  gland consistent with primary prostate adenocarcinoma. 2. No evidence of metastatic adenopathy in the pelvis or periaortic retroperitoneum. 3. No evidence of visceral metastasis or skeletal metastasis. Electronically Signed   By: Jackquline Boxer M.D.   On: 06/09/2024 08:38      IMPRESSION/PLAN: 1. 74 y.o. gentleman with Stage T2a adenocarcinoma of the prostate with Gleason Score of 4+4, and PSA of 5. We discussed the patient's workup and outlined the nature of prostate cancer in this setting. The patient's T stage, Gleason's score, and PSA put him into the high risk group. Accordingly, he is eligible for a variety of potential treatment options including prostatectomy or LT-ADT concurrent with either 8 weeks of external radiation or 5 weeks of external radiation with an upfront brachytherapy boost. We discussed the available radiation techniques, and focused on the details and logistics of delivery. We discussed and outlined the risks, benefits, short and long-term effects associated with radiotherapy and compared and contrasted these with prostatectomy. We discussed the role of SpaceOAR gel in reducing the rectal toxicity associated with radiotherapy. We also detailed the role of ADT in the treatment of high risk prostate cancer and outlined the associated side effects that could be expected with this therapy. Given the evidence of coronary and aortic calcifications on PSMA PET that indicate a high risk for heart disease, we would recommend Orgovyx ADT which carries the lowest cardiac risk profile.  He and his family were encouraged to ask questions that were answered to their stated satisfaction.  At the conclusion of our conversation, the patient is interested in  moving forward with brachytherapy boost and use of SpaceOAR gel followed by a 5 week course of daily radiotherapy concurrent with LT-ADT. We will share our discussion with Dr. Selma and coordinate for a follow up visit in the urology office, first available, to start ADT now, ideally Orgovyx, as above. We will also coordinate for the seed boost procedure approximately 2 months from the start of ADT.  The patient met briefly with Orlean Gunner in our office who will be working closely with him to coordinate OR scheduling and pre and post procedure appointments.  We will contact the pharmaceutical rep to ensure that SpaceOAR is available at the time of procedure.  We will see him back 2-3 weeks after his seed boost procedure and will proceed with CT SIM prostate for treatment planning in anticipation of beginning the 5 week course of daily external beam radiation 3-4 weeks after the seed boost procedure. He appears to have a good understanding of his disease and our treatment recommendations which are of curative intent and he is in agreement with the stated plan. We enjoyed meeting him and his family and look forward to continuing to participate in his care. They know that they are welcome to call at any time in the interim with any questions or concerns.  We personally spent 70 minutes in this encounter including chart review, reviewing radiological studies, meeting face-to-face with the patient and family, entering orders, coordinating care and completing documentation.    Sabra MICAEL Rusk, PA-C    Donnice Barge, MD  [facility]  Radiation Oncology Direct Dial: [phone]  Fax: [phone] [email or web address]  Skype  [email or web address]   This document serves as a record of services personally performed by Donnice Barge, MD and Sabra Rusk, PA-C. It was created on their behalf by Izetta Neither, a trained medical scribe. The creation of this record is based on the scribe's personal observations and  the provider's  statements to them. This document has been checked and approved by the attending provider.

## 2024-06-19 ENCOUNTER — Other Ambulatory Visit: Payer: Self-pay | Admitting: Urology

## 2024-06-20 ENCOUNTER — Telehealth: Payer: Self-pay | Admitting: *Deleted

## 2024-06-20 NOTE — Telephone Encounter (Signed)
 PATIENT WILL HAVE ADT 06-26-24 @ DR. CIRA OFFICE

## 2024-06-20 NOTE — Telephone Encounter (Signed)
Called patient to inform of pre-seed appts. and implant date, spoke with patient and he is aware of these appts.

## 2024-06-26 DIAGNOSIS — C61 Malignant neoplasm of prostate: Secondary | ICD-10-CM | POA: Diagnosis not present

## 2024-06-26 NOTE — Progress Notes (Signed)
 Patient received Orgovyx through [facility] on 9/11 and is set up for his brachy boost procedure on 08/18/2024.  Plan of care in progress.  RN will reach out to patient closer to procedure date to assess any new needs at that time.

## 2024-07-15 NOTE — Progress Notes (Signed)
 RN spoke with patient to follow up after recent consult.  Education provided on next steps post brachy boost.  All questions answered.  No additional needs at this time. Plan of care in progress.

## 2024-07-22 ENCOUNTER — Telehealth: Payer: Self-pay | Admitting: *Deleted

## 2024-07-22 NOTE — Telephone Encounter (Signed)
 CALLED PATIENT TO REMND OF PRE-SEED APPTS. FOR 07-24-24, SPOKE WITH PATIENT AND HE IS AWARE OF THESE APPTS.

## 2024-07-22 NOTE — Progress Notes (Signed)
 Pre-seed nursing interview for a diagnosis:  73 y.o. 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   Patient identity verified x2.   Patient states issues as follows...  -Pain: None -Fatigue: yes -Abdomen: None  -Groin: None -Urinary: Some frequency -Bowels: None -Appetite: Good Weight 269.8 Patient denies all other related issues at this time.  Meaningful use complete.  Urinary Management medication(s)- None Urology appointment date- Had an appointment with [name] last week  No vitals needed for this visit.  This concludes the interaction.  Patient declines to watch the pre-seed educational video.

## 2024-07-24 ENCOUNTER — Ambulatory Visit
Admission: RE | Admit: 2024-07-24 | Discharge: 2024-07-24 | Disposition: A | Discharge status: Home / Self Care | Source: Ambulatory Visit | Attending: Urology | Admitting: Urology

## 2024-07-24 ENCOUNTER — Ambulatory Visit
Admission: RE | Admit: 2024-07-24 | Discharge: 2024-07-24 | Disposition: A | Discharge status: Home / Self Care | Source: Ambulatory Visit | Attending: Radiation Oncology | Admitting: Radiation Oncology

## 2024-07-24 ENCOUNTER — Encounter: Payer: Self-pay | Admitting: Radiation Oncology

## 2024-07-24 DIAGNOSIS — Z191 Hormone sensitive malignancy status: Secondary | ICD-10-CM | POA: Diagnosis not present

## 2024-07-24 DIAGNOSIS — C61 Malignant neoplasm of prostate: Secondary | ICD-10-CM | POA: Insufficient documentation

## 2024-07-24 NOTE — Addendum Note (Signed)
 Encounter addended by: Sherwood Rise, PA-C on: 07/24/2024 10:15 AM  Actions taken: Clinical Note Signed

## 2024-07-24 NOTE — Progress Notes (Addendum)
 Radiation Oncology         [phone] ________________________________  Outpatient Follow up- Pre-seed visit  Name: Jeffrey Banks MRN: [id number]  Date: 07/24/2024  DOB: 08/19/1951  RR:Ejuzmdnw, Toribio MATSU, MD  Selma Donnice SAUNDERS, MD   REFERRING PHYSICIAN: Selma Donnice SAUNDERS, MD  DIAGNOSIS: 73 y.o. 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     ICD-10-CM   1. Malignant neoplasm of prostate (HCC)  C61       HISTORY OF PRESENT ILLNESS: Jeffrey Banks is a 73 y.o. male with a diagnosis of prostate cancer. He was noted to have an elevated PSA of 6.7 by his primary care physician, Dr. Yolande.  Accordingly, he was referred for evaluation in urology by Dr. Selma on 03/18/24,  digital rectal examination performed at that time showed a nodule on the right aspect of the prostate. A repeat PSA obtained that day showed a drop but persistent elevation at 4.95. The patient proceeded to transrectal ultrasound with 12 biopsies of the prostate on 05/13/24.  The prostate volume measured 49 cc.  Out of 12 core biopsies, all 12 were positive.  The maximum Gleason score was 4+4, and this was seen in right base lateral (with perineural invasion). Additionally, Gleason 3+4 was seen in the remaining right-sided cores, and Gleason 3+3 in all six left-sided cores.   He underwent a staging PSMA PET scan on 05/30/24 showing no evidence of disease outside of the prostate. There is a dominant intraprostatic nodule in the right lobe of the prostate, correlating with DRE and biopsy findings. There is also evidence of coronary artery calcifications and calcifications within the aorta which may increase his risks of cardiac events so we will share this information with his PCP so that he can implement any necessary measures to decrease cardiac risk.    The patient reviewed the biopsy results with his urologist and was kindly referred to us  for discussion of potential radiation treatment  options. We initially met the patient on 06/13/24 and he was most interested in proceeding with brachytherapy boost and use of SpaceOAR gel followed by a 5 week course of daily radiotherapy concurrent with LT-ADT for treatment of his disease. He started ADT with Orgovyx on 06/26/2024 and is here today for his pre-procedure imaging for planning and to answer any additional questions he may have about this treatment.   PREVIOUS RADIATION THERAPY: No  PAST MEDICAL HISTORY:  Past Medical History:  Diagnosis Date   Arthritis    Back pain 10/17/2007   Cough    Depression    Diabetes mellitus without complication (HCC)    borderline    Elevated PSA    Hyperlipidemia    Hypertension    Impaired fasting glucose 10/16/2012   Neuromuscular disorder (HCC)    neuropathy feet   OSA (obstructive sleep apnea) 10/16/2004   on CPAP does not know settings    Rhinitis    Stenosis of cervical spine       PAST SURGICAL HISTORY: Past Surgical History:  Procedure Laterality Date   bilateral cataract surgery      with lens implants    INCISION AND DRAINAGE OF WOUND Right 02/27/2024   Procedure: IRRIGATION AND DEBRIDEMENT WOUND;  Surgeon: Duwayne Purchase, MD;  Location: [facility];  Service: Orthopedics;  Laterality: Right;  Right knee revision of non healing total knee incision   lamenectomy  10/16/2008   PROSTATE BIOPSY  TONSILLECTOMY  10/17/1967   TOTAL KNEE ARTHROPLASTY Left 05/06/2015   Procedure: LEFT TOTAL KNEE ARTHROPLASTY;  Surgeon: Reyes Billing, MD;  Location: [facility];  Service: Orthopedics;  Laterality: Left;   TOTAL KNEE ARTHROPLASTY Right 12/26/2023   Procedure: ARTHROPLASTY, KNEE, TOTAL;  Surgeon: Billing Reyes, MD;  Location: [facility];  Service: Orthopedics;  Laterality: Right;    FAMILY HISTORY:  Family History  Problem Relation Age of Onset   High Cholesterol Mother    Heart failure Father     SOCIAL HISTORY:  Social History   Socioeconomic History   Marital status: Married     Spouse name: Lonell   Number of children: 2   Years of education: college   Highest education level: Not on file  Occupational History    Comment: Health and safety inspector  Tobacco Use   Smoking status: Former    Types: Cigarettes   Smokeless tobacco: Former   Tobacco comments:    Quit 40 years ago  Advertising account planner   Vaping status: Never Used  Substance and Sexual Activity   Alcohol  use: No    Alcohol /week: 0.0 standard drinks of alcohol    Drug use: No   Sexual activity: Not Currently  Other Topics Concern   Not on file  Social History Narrative   Consumes 2-3 cups of caffeine daily   Social Drivers of Corporate investment banker Strain: Not on file  Food Insecurity: No Food Insecurity (06/13/2024)   Hunger Vital Sign    Worried About Running Out of Food in the Last Year: Never true    Ran Out of Food in the Last Year: Never true  Transportation Needs: No Transportation Needs (06/13/2024)   PRAPARE - Administrator, Civil Service (Medical): No    Lack of Transportation (Non-Medical): No  Physical Activity: Not on file  Stress: Not on file  Social Connections: Unknown (12/26/2023)   Social Connection and Isolation Panel    Frequency of Communication with Friends and Family: More than three times a week    Frequency of Social Gatherings with Friends and Family: Three times a week    Attends Religious Services: Patient declined    Active Member of Clubs or Organizations: Patient declined    Attends Banker Meetings: More than 4 times per year    Marital Status: Married  Catering manager Violence: Not At Risk (06/13/2024)   Humiliation, Afraid, Rape, and Kick questionnaire    Fear of Current or Ex-Partner: No    Emotionally Abused: No    Physically Abused: No    Sexually Abused: No    ALLERGIES: Pravastatin, Tirzepatide, and Tape  MEDICATIONS:  Current Outpatient Medications  Medication Sig Dispense Refill   acetaminophen  (TYLENOL ) 500 MG tablet Take  500-1,000 mg by mouth every 6 (six) hours as needed (pain.).     Evolocumab (REPATHA SURECLICK) 140 MG/ML SOAJ Inject 140 mg into the skin every 14 (fourteen) days. Wednesdays.     fexofenadine (ALLEGRA) 180 MG tablet Take 180 mg by mouth daily.     ORGOVYX 120 MG tablet Take 120 mg by mouth daily.     pioglitazone (ACTOS) 15 MG tablet Take 15 mg by mouth in the morning.     propranolol  (INDERAL ) 10 MG tablet Take 10 mg by mouth 2 (two) times daily as needed (anxiety).     No current facility-administered medications for this encounter.    REVIEW OF SYSTEMS:   On review of systems, the patient reports  that he is doing well overall. He denies any chest pain, shortness of breath, cough, fevers, chills, night sweats, unintended weight changes. He denies any bowel disturbances, and denies abdominal pain, nausea or vomiting. He denies any new musculoskeletal or joint aches or pains. His IPSS was 8, indicating mild urinary symptoms. His SHIM was 7, indicating he has moderate-severe erectile dysfunction. A complete review of systems is obtained and is otherwise negative.     PHYSICAL EXAM:  Wt Readings from Last 3 Encounters:  06/13/24 264 lb 6.4 oz (119.9 kg)  03/02/24 251 lb (113.9 kg)  02/27/24 251 lb (113.9 kg)   Temp Readings from Last 3 Encounters:  06/13/24 97.9 F (36.6 C)  03/02/24 98.2 F (36.8 C) (Oral)  02/27/24 97.6 F (36.4 C) ((P) Oral)   BP Readings from Last 3 Encounters:  06/13/24 (!) 169/88  03/02/24 (!) 169/72  02/27/24 (!) 155/70   Pulse Readings from Last 3 Encounters:  06/13/24 68  03/02/24 89  02/27/24 67    /10  In general this is a well appearing Caucasian male in no acute distress. He's alert and oriented x4 and appropriate throughout the examination. Cardiopulmonary assessment is negative for acute distress, and he exhibits normal effort.     KPS = 100  100 - Normal; no complaints; no evidence of disease. 90   - Able to carry on normal activity; minor  signs or symptoms of disease. 80   - Normal activity with effort; some signs or symptoms of disease. 29   - Cares for self; unable to carry on normal activity or to do active work. 60   - Requires occasional assistance, but is able to care for most of his personal needs. 50   - Requires considerable assistance and frequent medical care. 40   - Disabled; requires special care and assistance. 30   - Severely disabled; hospital admission is indicated although death not imminent. 20   - Very sick; hospital admission necessary; active supportive treatment necessary. 10   - Moribund; fatal processes progressing rapidly. 0     - Dead  Karnofsky DA, Abelmann WH, Craver LS and Burchenal [facility] [phone]) The use of the nitrogen mustards in the palliative treatment of carcinoma: with particular reference to bronchogenic carcinoma Cancer 1 634-56  LABORATORY DATA:  Lab Results  Component Value Date   WBC 4.5 02/26/2024   HGB 13.0 02/26/2024   HCT 42.3 02/26/2024   MCV 91.4 02/26/2024   PLT 266 02/26/2024   Lab Results  Component Value Date   NA 135 02/26/2024   K 4.2 02/26/2024   CL 103 02/26/2024   CO2 26 02/26/2024   No results found for: ALT, AST, GGT, ALKPHOS, BILITOT   RADIOGRAPHY: No results found.    IMPRESSION/PLAN: 1. 73 y.o. 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  The patient has elected to proceed with brachytherapy boost and use of SpaceOAR gel followed by a 5 week course of daily radiotherapy concurrent with LT-ADT for treatment of his disease.  He started Orgovyx ADT on 06/26/2024.  We reviewed the risks, benefits, short and long-term effects associated with brachytherapy and discussed the role of SpaceOAR in reducing the rectal toxicity associated with radiotherapy.  He appears to have a good understanding of his disease and our treatment recommendations which are of curative intent.  He was encouraged to ask questions  that were answered to his stated satisfaction. He has freely  signed written consent to proceed today in the office and a copy of this document will be placed in his medical record. His procedure is tentatively scheduled for 08/18/24 in collaboration with Dr. Selma and we will see him back for his post-procedure visit approximately 2-3 weeks after his seed implant and will proceed with CT SIM prostate for treatment planning in anticipation of beginning the 5 week course of daily external beam radiation 3-4 weeks after the seed boost procedure. We look forward to continuing to participate in his care. He knows that he is welcome to call with any questions or concerns at any time in the interim.  I personally spent 30 minutes in this encounter including chart review, reviewing radiological studies, meeting face-to-face with the patient, entering orders and completing documentation.    Sabra MICAEL Rusk, MMS, PA-C [place]  [facility] Radiation Oncology Physician Assistant Direct Dial: [phone]  Fax: [phone]

## 2024-07-24 NOTE — Progress Notes (Signed)
  Radiation Oncology         [phone] ________________________________  Name: Jeffrey Banks MRN: [id number]  Date: 07/24/2024  DOB: 02-Nov-1950  SIMULATION AND TREATMENT PLANNING NOTE PUBIC ARCH STUDY Seed Implant Boost to be Followed by IMRT  RR:Ejuzmdnw, Toribio MATSU, MD  Selma Donnice SAUNDERS, MD  DIAGNOSIS: 73 y.o. 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   Oncology History  Malignant neoplasm of prostate (HCC)  05/13/2024 Cancer Staging   Staging form: Prostate, AJCC 8th Edition - Clinical stage from 05/13/2024: Stage IIC (cT1c, cN0, cM0, PSA: 5, Grade Group: 4) - Signed by Sherwood Rise, PA-C on 06/12/2024 Histopathologic type: Adenocarcinoma, NOS Stage prefix: Initial diagnosis Prostate specific antigen (PSA) range: Less than 10 Gleason primary pattern: 4 Gleason secondary pattern: 4 Gleason score: 8 Histologic grading system: 5 grade system Number of biopsy cores examined: 12 Number of biopsy cores positive: 12 Location of positive needle core biopsies: Both sides   06/12/2024 Initial Diagnosis   Malignant neoplasm of prostate (HCC)       ICD-10-CM   1. Malignant neoplasm of prostate (HCC)  C61       COMPLEX SIMULATION:  The patient presented today for evaluation for possible prostate seed implant. He was brought to the radiation planning suite and placed supine on the CT couch. A 3-dimensional image study set was obtained in upload to the planning computer. There, on each axial slice, I contoured the prostate gland. Then, using three-dimensional radiation planning tools I reconstructed the prostate in view of the structures from the transperineal needle pathway to assess for possible pubic arch interference. In doing so, I do appreciate moderate pubic arch interference, but, with increased angulation by 10 more degrees, this can be reduced. Also, the patient's prostate volume was estimated based on the drawn structure. The  volume was 44 cc.  Given the pubic arch appearance and prostate volume, patient remains a good candidate to proceed with prostate seed implant. Today, he freely provided informed written consent to proceed.    PLAN: The patient will undergo prostate seed implant boost to 110 Gy to be followed by IMRT.  We will employ an exaggerated dorsal lithotomy position in the OR.   ________________________________  Donnice LABOR. Patrcia, M.D.

## 2024-07-28 NOTE — Progress Notes (Signed)
 Anesthesia Review:  PCP: Cardiologist :  PPM/ ICD: Device Orders: Rep Notified:  Chest x-ray : EKG : 12/19/2023  Echo : Stress test: Cardiac Cath :   Activity level:  Sleep Study/ CPAP : Fasting Blood Sugar :      / Checks Blood Sugar -- times a day:     DM- type  Hgba1c-  Actos- none am of surgery   Blood Thinner/ Instructions /Last Dose: ASA / Instructions/ Last Dose :

## 2024-07-28 NOTE — Patient Instructions (Signed)
 SURGICAL WAITING ROOM VISITATION  Patients having surgery or a procedure may have no more than 2 support people in the waiting area - these visitors may rotate.    Children under the age of 16 must have an adult with them who is not the patient.  Visitors with respiratory illnesses are discouraged from visiting and should remain at home.  If the patient needs to stay at the hospital during part of their recovery, the visitor guidelines for inpatient rooms apply. Pre-op nurse will coordinate an appropriate time for 1 support person to accompany patient in pre-op.  This support person may not rotate.    Please refer to the [facility] website for the visitor guidelines for Inpatients (after your surgery is over and you are in a regular room).       Your procedure is scheduled on:  08/18/2024    Report to [facility] Main Entrance    Report to admitting at  1100 AM   Call this number if you have problems the morning of surgery [phone]   Do not eat food :After Midnight.   After Midnight you may have the following liquids until _ 1000_____ AM DAY OF SURGERY  Water  Non-Citrus Juices (without pulp, NO RED-Apple, White grape, White cranberry) Black Coffee (NO MILK/CREAM OR CREAMERS, sugar ok)  Clear Tea (NO MILK/CREAM OR CREAMERS, sugar ok) regular and decaf                             Plain Jell-O (NO RED)                                           Fruit ices (not with fruit pulp, NO RED)                                     Popsicles (NO RED)                                                               Sports drinks like Gatorade (NO RED)  FLEETS ENEMA AM OF SURGERY                            If you have questions, please contact your surgeon's office.       Oral Hygiene is also important to reduce your risk of infection.                                    Remember - BRUSH YOUR TEETH THE MORNING OF SURGERY WITH YOUR REGULAR TOOTHPASTE  DENTURES WILL BE REMOVED  PRIOR TO SURGERY PLEASE DO NOT APPLY Poly grip OR ADHESIVES!!!   Do NOT smoke after Midnight   Stop all vitamins and herbal supplements 7 days before surgery.   Take these medicines the morning of surgery with A SIP OF WATER :  allegra, propanolol if needed  Actos- none am of surgery   DO NOT TAKE ANY ORAL DIABETIC MEDICATIONS DAY OF YOUR SURGERY  Bring CPAP mask and tubing day of surgery.                              You may not have any metal on your body including hair pins, jewelry, and body piercing             Do not wear make-up, lotions, powders, perfumes/cologne, or deodorant  Do not wear nail polish including gel and S&S, artificial/acrylic nails, or any other type of covering on natural nails including finger and toenails. If you have artificial nails, gel coating, etc. that needs to be removed by a nail salon please have this removed prior to surgery or surgery may need to be canceled/ delayed if the surgeon/ anesthesia feels like they are unable to be safely monitored.   Do not shave  48 hours prior to surgery.               Men may shave face and neck.   Do not bring valuables to the hospital. [facility] IS NOT             RESPONSIBLE   FOR VALUABLES.   Contacts, glasses, dentures or bridgework may not be worn into surgery.   Bring small overnight bag day of surgery.   DO NOT BRING YOUR HOME MEDICATIONS TO THE HOSPITAL. PHARMACY WILL DISPENSE MEDICATIONS LISTED ON YOUR MEDICATION LIST TO YOU DURING YOUR ADMISSION IN THE HOSPITAL!    Patients discharged on the day of surgery will not be allowed to drive home.  Someone NEEDS to stay with you for the first 24 hours after anesthesia.   Special Instructions: Bring a copy of your healthcare power of attorney and living will documents the day of surgery if you haven't scanned them before.              Please read over the following fact sheets you were given: IF YOU HAVE QUESTIONS ABOUT YOUR PRE-OP  INSTRUCTIONS PLEASE CALL [phone].   If you received a COVID test during your pre-op visit  it is requested that you wear a mask when out in public, stay away from anyone that may not be feeling well and notify your surgeon if you develop symptoms. If you test positive for Covid or have been in contact with anyone that has tested positive in the last 10 days please notify you surgeon.    [place] - Preparing for Surgery Before surgery, you can play an important role.  Because skin is not sterile, your skin needs to be as free of germs as possible.  You can reduce the number of germs on your skin by washing with CHG (chlorahexidine gluconate) soap before surgery.  CHG is an antiseptic cleaner which kills germs and bonds with the skin to continue killing germs even after washing. Please DO NOT use if you have an allergy to CHG or antibacterial soaps.  If your skin becomes reddened/irritated stop using the CHG and inform your nurse when you arrive at Short Stay. Do not shave (including legs and underarms) for at least 48 hours prior to the first CHG shower.  You may shave your face/neck.  Please follow these instructions carefully:  1.  Shower with CHG Soap the night before surgery ONLY (DO NOT USE THE SOAP THE MORNING OF SURGERY).  2.  If  you choose to wash your hair, wash your hair first as usual with your normal  shampoo.  3.  After you shampoo, rinse your hair and body thoroughly to remove the shampoo.                             4.  Use CHG as you would any other liquid soap.  You can apply chg directly to the skin and wash.  Gently with a scrungie or clean washcloth.  5.  Apply the CHG Soap to your body ONLY FROM THE NECK DOWN.   Do   not use on face/ open                           Wound or open sores. Avoid contact with eyes, ears mouth and   genitals (private parts).                       Wash face,  Genitals (private parts) with your normal soap.             6.  Wash thoroughly, paying  special attention to the area where your    surgery  will be performed.  7.  Thoroughly rinse your body with warm water  from the neck down.  8.  DO NOT shower/wash with your normal soap after using and rinsing off the CHG Soap.                9.  Pat yourself dry with a clean towel.            10.  Wear clean pajamas.            11.  Place clean sheets on your bed the night of your first shower and do not  sleep with pets. Day of Surgery : Do not apply any CHG, lotions/deodorants the morning of surgery.  Please wear clean clothes to the hospital/surgery center.  FAILURE TO FOLLOW THESE INSTRUCTIONS MAY RESULT IN THE CANCELLATION OF YOUR SURGERY  PATIENT SIGNATURE_________________________________  NURSE SIGNATURE__________________________________  ________________________________________________________________________

## 2024-07-30 ENCOUNTER — Encounter (HOSPITAL_COMMUNITY): Payer: Self-pay

## 2024-07-30 ENCOUNTER — Encounter (HOSPITAL_COMMUNITY)
Admission: RE | Admit: 2024-07-30 | Discharge: 2024-07-30 | Disposition: A | Discharge status: Home / Self Care | Source: Ambulatory Visit | Attending: Urology | Admitting: Urology

## 2024-07-30 ENCOUNTER — Other Ambulatory Visit: Payer: Self-pay

## 2024-07-30 VITALS — BP 150/73 | HR 52 | Temp 98.2°F | Resp 16 | Ht 68.5 in | Wt 244.0 lb

## 2024-07-30 DIAGNOSIS — E119 Type 2 diabetes mellitus without complications: Secondary | ICD-10-CM | POA: Diagnosis not present

## 2024-07-30 DIAGNOSIS — Z01818 Encounter for other preprocedural examination: Secondary | ICD-10-CM | POA: Insufficient documentation

## 2024-07-30 DIAGNOSIS — Z01812 Encounter for preprocedural laboratory examination: Secondary | ICD-10-CM | POA: Diagnosis not present

## 2024-07-30 HISTORY — DX: Malignant (primary) neoplasm, unspecified: C80.1

## 2024-07-30 HISTORY — DX: Peripheral vascular disease, unspecified: I73.9

## 2024-07-30 LAB — BASIC METABOLIC PANEL WITH GFR
Anion gap: 9 (ref 5–15)
BUN: 21 mg/dL (ref 8–23)
CO2: 26 mmol/L (ref 22–32)
Calcium: 9.4 mg/dL (ref 8.9–10.3)
Chloride: 105 mmol/L (ref 98–111)
Creatinine, Ser: 0.91 mg/dL (ref 0.61–1.24)
GFR, Estimated: >60 mL/min (ref >60)
Glucose, Bld: 110 mg/dL — ABNORMAL HIGH (ref 70–99)
Potassium: 4.2 mmol/L (ref 3.5–5.1)
Sodium: 140 mmol/L (ref 135–145)

## 2024-07-30 LAB — HEMOGLOBIN A1C
Hgb A1c MFr Bld: 5.7 % — ABNORMAL HIGH (ref 4.8–5.6)
Mean Plasma Glucose: 116.89 mg/dL

## 2024-07-30 LAB — CBC
HCT: 42.7 % (ref 39.0–52.0)
Hemoglobin: 13.8 g/dL (ref 13.0–17.0)
MCH: 28.7 pg (ref 26.0–34.0)
MCHC: 32.3 g/dL (ref 30.0–36.0)
MCV: 88.8 fL (ref 80.0–100.0)
Platelets: 228 K/uL (ref 150–400)
RBC: 4.81 MIL/uL (ref 4.22–5.81)
RDW: 12.9 % (ref 11.5–15.5)
WBC: 4.3 K/uL (ref 4.0–10.5)
nRBC: 0 % (ref 0.0–0.2)

## 2024-07-30 LAB — GLUCOSE, CAPILLARY: Glucose-Capillary: 118 mg/dL — ABNORMAL HIGH (ref 70–99)

## 2024-08-05 DIAGNOSIS — C61 Malignant neoplasm of prostate: Secondary | ICD-10-CM | POA: Diagnosis not present

## 2024-08-07 ENCOUNTER — Ambulatory Visit: Payer: Self-pay | Admitting: Urology

## 2024-08-07 ENCOUNTER — Ambulatory Visit: Admitting: Radiation Oncology

## 2024-08-15 ENCOUNTER — Telehealth: Payer: Self-pay | Admitting: *Deleted

## 2024-08-15 NOTE — Telephone Encounter (Signed)
 CALLED PATIENT TO REMIND OF PROCEDURE FOR 10-29-23, SPOKE WITH PATIENT AND HE IS AWARE OF THIS PROCEDURE

## 2024-08-18 ENCOUNTER — Ambulatory Visit (HOSPITAL_COMMUNITY): Payer: Self-pay | Admitting: Physician Assistant

## 2024-08-18 ENCOUNTER — Encounter (HOSPITAL_COMMUNITY)
Admission: RE | Disposition: A | Discharge status: Home / Self Care | Payer: Self-pay | Source: Ambulatory Visit | Attending: Urology

## 2024-08-18 ENCOUNTER — Encounter (HOSPITAL_COMMUNITY): Payer: Self-pay | Admitting: Urology

## 2024-08-18 ENCOUNTER — Ambulatory Visit (HOSPITAL_COMMUNITY)
Admission: RE | Admit: 2024-08-18 | Discharge: 2024-08-18 | Disposition: A | Discharge status: Home / Self Care | Source: Ambulatory Visit | Attending: Urology | Admitting: Urology

## 2024-08-18 ENCOUNTER — Ambulatory Visit (HOSPITAL_COMMUNITY): Payer: Self-pay

## 2024-08-18 ENCOUNTER — Other Ambulatory Visit: Payer: Self-pay

## 2024-08-18 ENCOUNTER — Ambulatory Visit (HOSPITAL_COMMUNITY)

## 2024-08-18 DIAGNOSIS — Z191 Hormone sensitive malignancy status: Secondary | ICD-10-CM | POA: Diagnosis not present

## 2024-08-18 DIAGNOSIS — E119 Type 2 diabetes mellitus without complications: Secondary | ICD-10-CM | POA: Insufficient documentation

## 2024-08-18 DIAGNOSIS — C61 Malignant neoplasm of prostate: Secondary | ICD-10-CM | POA: Insufficient documentation

## 2024-08-18 DIAGNOSIS — G4730 Sleep apnea, unspecified: Secondary | ICD-10-CM | POA: Diagnosis not present

## 2024-08-18 DIAGNOSIS — I10 Essential (primary) hypertension: Secondary | ICD-10-CM

## 2024-08-18 DIAGNOSIS — Z87891 Personal history of nicotine dependence: Secondary | ICD-10-CM | POA: Insufficient documentation

## 2024-08-18 DIAGNOSIS — Z01818 Encounter for other preprocedural examination: Secondary | ICD-10-CM

## 2024-08-18 HISTORY — PX: RADIOACTIVE SEED IMPLANT: SHX5150

## 2024-08-18 HISTORY — PX: SPACE OAR INSTILLATION: SHX6769

## 2024-08-18 LAB — GLUCOSE, CAPILLARY
Glucose-Capillary: 113 mg/dL — ABNORMAL HIGH (ref 70–99)
Glucose-Capillary: 89 mg/dL (ref 70–99)

## 2024-08-18 SURGERY — INSERTION, RADIATION SOURCE, PROSTATE
Anesthesia: General

## 2024-08-18 MED ORDER — ONDANSETRON HCL 4 MG/2ML IJ SOLN
INTRAMUSCULAR | Status: AC
  Filled 2024-08-18: qty 2

## 2024-08-18 MED ORDER — PROPOFOL 10 MG/ML IV BOLUS
INTRAVENOUS | Status: AC
  Filled 2024-08-18: qty 20

## 2024-08-18 MED ORDER — PROPOFOL 10 MG/ML IV BOLUS
INTRAVENOUS | Status: DC | PRN
  Administered 2024-08-18: 100 mg via INTRAVENOUS
  Administered 2024-08-18: 40 mg via INTRAVENOUS

## 2024-08-18 MED ORDER — OXYCODONE HCL 5 MG PO TABS: 5.0000 mg | ORAL_TABLET | Freq: Once | ORAL | Status: DC | PRN

## 2024-08-18 MED ORDER — ROCURONIUM BROMIDE 100 MG/10ML IV SOLN
INTRAVENOUS | Status: DC | PRN
  Administered 2024-08-18: 70 mg via INTRAVENOUS
  Administered 2024-08-18: 10 mg via INTRAVENOUS

## 2024-08-18 MED ORDER — ROCURONIUM BROMIDE 10 MG/ML (PF) SYRINGE
PREFILLED_SYRINGE | INTRAVENOUS | Status: AC
  Filled 2024-08-18: qty 10

## 2024-08-18 MED ORDER — OXYCODONE-ACETAMINOPHEN 5-325 MG PO TABS: 1.0000 | ORAL_TABLET | ORAL | 0 refills | Status: AC | PRN

## 2024-08-18 MED ORDER — LIDOCAINE HCL (CARDIAC) PF 100 MG/5ML IV SOSY
PREFILLED_SYRINGE | INTRAVENOUS | Status: DC | PRN
  Administered 2024-08-18: 100 mg via INTRAVENOUS

## 2024-08-18 MED ORDER — SODIUM CHLORIDE 0.9 % IR SOLN
Status: DC | PRN
  Administered 2024-08-18: 1000 mL via INTRAVESICAL

## 2024-08-18 MED ORDER — ACETAMINOPHEN 10 MG/ML IV SOLN
1000.0000 mg | Freq: Once | INTRAVENOUS | Status: DC | PRN
Start: 2024-08-18 — End: 2024-08-18

## 2024-08-18 MED ORDER — PHENYLEPHRINE HCL (PRESSORS) 10 MG/ML IV SOLN
INTRAVENOUS | Status: DC | PRN
  Administered 2024-08-18: 40 ug via INTRAVENOUS
  Administered 2024-08-18: 80 ug via INTRAVENOUS

## 2024-08-18 MED ORDER — DROPERIDOL 2.5 MG/ML IJ SOLN: 0.6250 mg | Freq: Once | INTRAMUSCULAR | Status: DC | PRN

## 2024-08-18 MED ORDER — CHLORHEXIDINE GLUCONATE 0.12 % MT SOLN
15.0000 mL | Freq: Once | OROMUCOSAL | Status: AC
  Administered 2024-08-18: 15 mL via OROMUCOSAL

## 2024-08-18 MED ORDER — ORAL CARE MOUTH RINSE: 15.0000 mL | Freq: Once | OROMUCOSAL | Status: AC

## 2024-08-18 MED ORDER — SODIUM CHLORIDE (PF) 0.9 % IJ SOLN
INTRAMUSCULAR | Status: AC
  Filled 2024-08-18: qty 10

## 2024-08-18 MED ORDER — ONDANSETRON HCL 4 MG/2ML IJ SOLN: 4.0000 mg | Freq: Once | INTRAMUSCULAR | Status: DC | PRN

## 2024-08-18 MED ORDER — FENTANYL CITRATE (PF) 100 MCG/2ML IJ SOLN
INTRAMUSCULAR | Status: AC
  Filled 2024-08-18: qty 2

## 2024-08-18 MED ORDER — DEXAMETHASONE SOD PHOSPHATE PF 10 MG/ML IJ SOLN
INTRAMUSCULAR | Status: DC | PRN
  Administered 2024-08-18: 5 mg via INTRAVENOUS

## 2024-08-18 MED ORDER — CIPROFLOXACIN IN D5W 400 MG/200ML IV SOLN
400.0000 mg | INTRAVENOUS | Status: AC
  Administered 2024-08-18: 400 mg via INTRAVENOUS
  Filled 2024-08-18: qty 200

## 2024-08-18 MED ORDER — FENTANYL CITRATE (PF) 100 MCG/2ML IJ SOLN
INTRAMUSCULAR | Status: DC | PRN
  Administered 2024-08-18 (×2): 25 ug via INTRAVENOUS
  Administered 2024-08-18: 50 ug via INTRAVENOUS

## 2024-08-18 MED ORDER — FENTANYL CITRATE (PF) 50 MCG/ML IJ SOSY: 25.0000 ug | PREFILLED_SYRINGE | INTRAMUSCULAR | Status: DC | PRN

## 2024-08-18 MED ORDER — ACETAMINOPHEN 10 MG/ML IV SOLN
INTRAVENOUS | Status: DC | PRN
  Administered 2024-08-18: 1000 mg via INTRAVENOUS

## 2024-08-18 MED ORDER — PHENYLEPHRINE 80 MCG/ML (10ML) SYRINGE FOR IV PUSH (FOR BLOOD PRESSURE SUPPORT)
PREFILLED_SYRINGE | INTRAVENOUS | Status: AC
  Filled 2024-08-18: qty 30

## 2024-08-18 MED ORDER — ACETAMINOPHEN 10 MG/ML IV SOLN
INTRAVENOUS | Status: AC
  Filled 2024-08-18: qty 100

## 2024-08-18 MED ORDER — STERILE WATER FOR IRRIGATION IR SOLN
Status: DC | PRN
  Administered 2024-08-18: 500 mL

## 2024-08-18 MED ORDER — SODIUM CHLORIDE (PF) 0.9 % IJ SOLN
INTRAMUSCULAR | Status: DC | PRN
  Administered 2024-08-18: 10 mL

## 2024-08-18 MED ORDER — IOHEXOL 300 MG/ML  SOLN
INTRAMUSCULAR | Status: DC | PRN
  Administered 2024-08-18: 5 mL

## 2024-08-18 MED ORDER — LIDOCAINE HCL (PF) 2 % IJ SOLN
INTRAMUSCULAR | Status: AC
  Filled 2024-08-18: qty 5

## 2024-08-18 MED ORDER — LACTATED RINGERS IV SOLN: INTRAVENOUS | Status: DC

## 2024-08-18 MED ORDER — ONDANSETRON HCL 4 MG/2ML IJ SOLN
INTRAMUSCULAR | Status: DC | PRN
  Administered 2024-08-18: 4 mg via INTRAVENOUS

## 2024-08-18 MED ORDER — OXYCODONE HCL 5 MG/5ML PO SOLN: 5.0000 mg | Freq: Once | ORAL | Status: DC | PRN

## 2024-08-18 MED ORDER — SUGAMMADEX SODIUM 200 MG/2ML IV SOLN
INTRAVENOUS | Status: DC | PRN
  Administered 2024-08-18: 300 mg via INTRAVENOUS

## 2024-08-18 MED ORDER — FLEET ENEMA RE ENEM: 1.0000 | ENEMA | Freq: Once | RECTAL | Status: DC

## 2024-08-18 SURGICAL SUPPLY — 32 items
BAG URINE DRAIN 2000ML AR STRL (UROLOGICAL SUPPLIES) IMPLANT
Bard BrachySource I-125 Implant Seed IMPLANT
CATH FOLEY 2WAY SLVR 5CC 16FR (CATHETERS) IMPLANT
CATH ROBINSON RED A/P 20FR (CATHETERS) ×1 IMPLANT
CNTNR URN SCR LID CUP LEK RST (MISCELLANEOUS) ×1 IMPLANT
COVER BACK TABLE 60X90IN (DRAPES) ×1 IMPLANT
COVER MAYO STAND STRL (DRAPES) ×1 IMPLANT
DRAPE SURG IRRIG POUCH 19X23 (DRAPES) ×1 IMPLANT
DRSG TEGADERM 4X4.75 (GAUZE/BANDAGES/DRESSINGS) IMPLANT
DRSG TEGADERM 8X12 (GAUZE/BANDAGES/DRESSINGS) ×2 IMPLANT
GLOVE BIOGEL M 7.0 STRL (GLOVE) ×1 IMPLANT
GLOVE SURG ORTHO 8.5 STRL (GLOVE) IMPLANT
GOWN STRL REUS W/ TWL XL LVL3 (GOWN DISPOSABLE) ×1 IMPLANT
GRID BRACH TEMP 18GA 2.8X3X.75 (MISCELLANEOUS) ×1 IMPLANT
HOLDER FOLEY CATH W/STRAP (MISCELLANEOUS) IMPLANT
IMPL SPACEOAR SYSTEM 10ML (Spacer) ×1 IMPLANT
KIT TURNOVER KIT A (KITS) ×1 IMPLANT
MARKER SKIN DUAL TIP RULER LAB (MISCELLANEOUS) ×1 IMPLANT
NDL BRACHY 18G 5PK (NEEDLE) ×4 IMPLANT
NDL PK MORGANSTERN STABILIZ (NEEDLE) ×1 IMPLANT
NEEDLE BRACHY 18G 5PK (NEEDLE) ×4 IMPLANT
NEEDLE PK MORGANSTERN STABILIZ (NEEDLE) ×1 IMPLANT
PACK CYSTO (CUSTOM PROCEDURE TRAY) ×1 IMPLANT
SHEATH ULTRASOUND LF (SHEATH) ×1 IMPLANT
SHEATH ULTRASOUND LTX NONSTRL (SHEATH) ×1 IMPLANT
SPIKE FLUID TRANSFER (MISCELLANEOUS) ×1 IMPLANT
SURGILUBE 2OZ TUBE FLIPTOP (MISCELLANEOUS) ×2 IMPLANT
SYR 10ML LL (SYRINGE) ×1 IMPLANT
SYR CONTROL 10ML LL (SYRINGE) ×1 IMPLANT
TOWEL OR 17X26 10 PK STRL BLUE (TOWEL DISPOSABLE) ×1 IMPLANT
TRAY FOLEY MTR SLVR 16FR STAT (SET/KITS/TRAYS/PACK) ×1 IMPLANT
UNDERPAD 30X36 HEAVY ABSORB (UNDERPADS AND DIAPERS) ×2 IMPLANT

## 2024-08-18 NOTE — Progress Notes (Signed)
  Radiation Oncology         [phone] ________________________________  Name: Jeffrey Banks MRN: [id number]  Date: 08/18/2024  DOB: Nov 15, 1950       Prostate Seed Implant  RR:Jeffrey Banks, Jeffrey MATSU, MD  No ref. provider found  DIAGNOSIS:  73 y.o. 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   Oncology History  Malignant neoplasm of prostate (HCC)  05/13/2024 Cancer Staging   Staging form: Prostate, AJCC 8th Edition - Clinical stage from 05/13/2024: Stage IIC (cT1c, cN0, cM0, PSA: 5, Grade Group: 4) - Signed by Sherwood Rise, PA-C on 06/12/2024 Histopathologic type: Adenocarcinoma, NOS Stage prefix: Initial diagnosis Prostate specific antigen (PSA) range: Less than 10 Gleason primary pattern: 4 Gleason secondary pattern: 4 Gleason score: 8 Histologic grading system: 5 grade system Number of biopsy cores examined: 12 Number of biopsy cores positive: 12 Location of positive needle core biopsies: Both sides   06/12/2024 Initial Diagnosis   Malignant neoplasm of prostate (HCC)       ICD-10-CM   1. Preop testing  Z01.818 CBG per protocol    CBG per protocol      PROCEDURE: Insertion of radioactive I-125 seeds into the prostate gland.  RADIATION DOSE: 110 Gy, boost therapy.  TECHNIQUE: Jeffrey Banks was brought to the operating room with the urologist. He was placed in the dorsolithotomy position. He was catheterized and a rectal tube was inserted. The perineum was shaved, prepped and draped. The ultrasound probe was then introduced by me into the rectum to see the prostate gland.  TREATMENT DEVICE: I attached the needle grid to the ultrasound probe stand and anchor needles were placed.  3D PLANNING: The prostate was imaged in 3D using a sagittal sweep of the prostate probe. These images were transferred to the planning computer. There, the prostate, urethra and rectum were defined on each axial reconstructed image. Then, the  software created an optimized 3D plan and a few seed positions were adjusted. The quality of the plan was reviewed using [facility] information for the target and the following two organs at risk:  Urethra and Rectum.  Then the accepted plan was printed and handed off to the radiation therapist.  Under my supervision, the custom loading of the seeds and spacers was carried out using the quick loader.  These pre-loaded needles were then placed into the needle holder.SABRA  PROSTATE VOLUME STUDY:  Using transrectal ultrasound the volume of the prostate was verified to be 38 cc.  SPECIAL TREATMENT PROCEDURE/SUPERVISION AND HANDLING: The pre-loaded needles were then delivered by the urologist under sagittal guidance. A total of 16 needles were used to deposit 54 seeds in the prostate gland. The individual seed activity was 0.385 mCi.  SpaceOAR:  Yes  COMPLEX SIMULATION: At the end of the procedure, an anterior radiograph of the pelvis was obtained to document seed positioning and count. Cystoscopy was performed by the urologist to check the urethra and bladder.  MICRODOSIMETRY: At the end of the procedure, the patient was emitting 0.055 mR/hr at 1 meter. Accordingly, he was considered safe for hospital discharge.  PLAN: The patient will return to the radiation oncology clinic for post implant CT dosimetry in three weeks.   ________________________________  Jeffrey Banks, M.D.

## 2024-08-18 NOTE — Anesthesia Preprocedure Evaluation (Addendum)
 Anesthesia Evaluation  Patient identified by MRN, date of birth, ID band Patient awake    Reviewed: Allergy & Precautions, NPO status , Patient's Chart, lab work & pertinent test results  History of Anesthesia Complications (+) PONV and history of anesthetic complications  Airway Mallampati: II  TM Distance: <3 FB Neck ROM: Limited    Dental  (+) Teeth Intact, Dental Advisory Given   Pulmonary sleep apnea and Continuous Positive Airway Pressure Ventilation , former smoker   breath sounds clear to auscultation       Cardiovascular hypertension, + Peripheral Vascular Disease   Rhythm:Regular Rate:Normal     Neuro/Psych  Neuromuscular disease    GI/Hepatic   Endo/Other  diabetes, Type 2    Renal/GU    Prostate Cancer    Musculoskeletal  (+) Arthritis ,  Cervical Spine Stenosis s/p ACDF   Abdominal   Peds  Hematology   Anesthesia Other Findings   Reproductive/Obstetrics                              Anesthesia Physical Anesthesia Plan  ASA: 3  Anesthesia Plan: General   Post-op Pain Management:    Induction: Intravenous  PONV Risk Score and Plan: 2 and Ondansetron  and Dexamethasone   Airway Management Planned: Oral ETT and Video Laryngoscope Planned  Additional Equipment:   Intra-op Plan:   Post-operative Plan: Extubation in OR  Informed Consent:      Dental advisory given  Plan Discussed with: Surgeon and CRNA  Anesthesia Plan Comments:         Anesthesia Quick Evaluation

## 2024-08-18 NOTE — Transfer of Care (Signed)
 Immediate Anesthesia Transfer of Care Note  Patient: Jeffrey Banks  Procedure(s) Performed: INSERTION, RADIATION SOURCE, PROSTATE INJECTION, HYDROGEL SPACER  Patient Location: PACU  Anesthesia Type:General  Level of Consciousness: awake  Airway & Oxygen Therapy: Patient Spontanous Breathing  Post-op Assessment: Report given to RN and Post -op Vital signs reviewed and stable  Post vital signs: Reviewed and stable  Last Vitals:  Vitals Value Taken Time  BP 158/64 08/18/24 14:33  Temp    Pulse 60 08/18/24 14:37  Resp 20 08/18/24 14:37  SpO2 96 % 08/18/24 14:37  Vitals shown include unfiled device data.  Last Pain:  Vitals:   08/18/24 1214  TempSrc: Oral  PainSc:       Patients Stated Pain Goal: 4 (08/18/24 1147)  Complications: No notable events documented.

## 2024-08-18 NOTE — H&P (Signed)
 Urology Preoperative H&P   Chief Complaint: Prostate cancer  History of Present Illness: Jeffrey Banks is a 73 y.o. male with prostate cancer here for brachytherapy and SpaceOAR. Denies fevers, chills, dysuria.  1. Localized high risk prostate cancer: Patient underwent prostate biopsy on 05/13/2024 for an elevated PSA of 6.7 ng/mL. 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 Denies new or worsening bone or back pain. Good appetite and stable weight. Family history: Uncles and cousins Imaging studies: PSMA PET scan 05/30/2024: Intense activity in the right posterior lobe of the prostate. No evidence of metastatic adenopathy, no visceral metastasis or skeletal metastasis. PMH: Obstructive sleep apnea PSH: No prior abdominal surgeries TNM stage: cT1cNxMx PSA: 6.7 Gleason score: 4+4 = 8 Biopsy: 05/13/2024 Left: GS 3+3 = 6 in all left cores Right: GS 4+4 = 8 and 1 core at the right lateral base; GS 3+4 = 7 in all remaining right-sided cores. Prostate volume: 49 cc PSAD: 0.14 He has met with Dr. Patrcia and has elected to proceed with LT-ADT with EBRT and brachytherapy boost. He will proceed with Orgovyx given his high risk for heart disease with coronary and aortic calcifications. He is scheduled for brachytherapy with SpaceOAR on 08/18/2024    Past Medical History:  Diagnosis Date   Arthritis    Back pain 10/17/2007   Cancer (HCC)    prostate cancer   Cough    Diabetes mellitus without complication (HCC)    borderline    Elevated PSA    Hyperlipidemia    Hypertension    Impaired fasting glucose 10/16/2012   Neuromuscular disorder (HCC)    neuropathy feet   OSA (obstructive sleep apnea) 10/16/2004   on CPAP does not know settings    Peripheral vascular disease    Rhinitis    Stenosis of cervical spine     Past Surgical History:   Procedure Laterality Date   bilateral cataract surgery      with lens implants    INCISION AND DRAINAGE OF WOUND Right 02/27/2024   Procedure: IRRIGATION AND DEBRIDEMENT WOUND;  Surgeon: Duwayne Purchase, MD;  Location: [facility];  Service: Orthopedics;  Laterality: Right;  Right knee revision of non healing total knee incision   lamenectomy  10/16/2008   PROSTATE BIOPSY     TONSILLECTOMY  10/17/1967   TOTAL KNEE ARTHROPLASTY Left 05/06/2015   Procedure: LEFT TOTAL KNEE ARTHROPLASTY;  Surgeon: Purchase Duwayne, MD;  Location: [facility];  Service: Orthopedics;  Laterality: Left;   TOTAL KNEE ARTHROPLASTY Right 12/26/2023   Procedure: ARTHROPLASTY, KNEE, TOTAL;  Surgeon: Duwayne Purchase, MD;  Location: [facility];  Service: Orthopedics;  Laterality: Right;    Allergies:  Allergies  Allergen Reactions   Pravastatin Other (See Comments)     myalgias   Tirzepatide Nausea And Vomiting   Tape Swelling and Rash    Paper or micropore tape is ok     Family History  Problem Relation Age of Onset   High Cholesterol Mother    Heart failure Father     Social History:  reports that he has quit smoking. His smoking use included cigarettes. He has quit using smokeless tobacco. He reports that he does not drink alcohol  and does not use drugs.  ROS: A complete review of systems was performed.  All systems are negative except  for pertinent findings as noted.  Physical Exam:  Vital signs in last 24 hours: Weight:  [110.7 kg] 110.7 kg (11/03 1147) Constitutional:  Alert and oriented, No acute distress Cardiovascular: Regular rate and rhythm Respiratory: Normal respiratory effort, Lungs clear bilaterally GI: Abdomen is soft, nontender, nondistended, no abdominal masses GU: No CVA tenderness Lymphatic: No lymphadenopathy Neurologic: Grossly intact, no focal deficits Psychiatric: Normal mood and affect  Laboratory Data:  No results for input(s): WBC, HGB, HCT, PLT in the last 72 hours.  No results  for input(s): NA, K, CL, GLUCOSE, BUN, CALCIUM, CREATININE in the last 72 hours.  Invalid input(s): CO3   No results found for this or any previous visit (from the past 24 hours). No results found for this or any previous visit (from the past 240 hours).  Renal Function: No results for input(s): CREATININE in the last 168 hours. Estimated Creatinine Clearance: 85.3 mL/min (by C-G formula based on SCr of 0.91 mg/dL).  Radiologic Imaging: No results found.  I independently reviewed the above imaging studies.  Assessment and Plan Jeffrey Banks is a 73 y.o. male with prostate cancer here for brachytherapy and SpaceOAR.   Matt R. Chantrice Hagg MD 08/18/2024, 12:01 PM  [facility] Pager: [phone]): [phone]

## 2024-08-18 NOTE — Anesthesia Procedure Notes (Signed)
 Procedure Name: Intubation Date/Time: 08/18/2024 1:14 PM  Performed by: Belvie Valri NOVAK, CRNAPre-anesthesia Checklist: Patient identified, Emergency Drugs available, Suction available and Patient being monitored Patient Re-evaluated:Patient Re-evaluated prior to induction Oxygen Delivery Method: Circle System Utilized Preoxygenation: Pre-oxygenation with 100% oxygen Induction Type: IV induction Ventilation: Two handed mask ventilation required and Oral airway inserted - appropriate to patient size Laryngoscope Size: Glidescope and 4 Grade View: Grade I Tube type: Oral Tube size: 7.5 mm Number of attempts: 1 Airway Equipment and Method: Stylet and Oral airway Placement Confirmation: ETT inserted through vocal cords under direct vision, positive ETCO2 and breath sounds checked- equal and bilateral Secured at: 21 cm Tube secured with: Tape Dental Injury: Teeth and Oropharynx as per pre-operative assessment  Difficulty Due To: Difficulty was anticipated, Difficult Airway- due to reduced neck mobility and Difficult Airway- due to large tongue

## 2024-08-18 NOTE — Op Note (Signed)
 PATIENT:  Jeffrey Banks  PRE-OPERATIVE DIAGNOSIS:  Adenocarcinoma of the prostate  POST-OPERATIVE DIAGNOSIS:  Same  PROCEDURE:  1. I-125 radioactive seed implantation 2. Cystoscopy  3. Placement of SpaceOAR  SURGEON:  Donnice Siad, MD  Radiation oncologist: Donnice Barge, MD  ANESTHESIA:  General  EBL:  Minimal  DRAINS: None  INDICATION: [name]  Description of procedure: After informed consent the patient was brought to the major OR, placed on the table and administered general anesthesia. He was then moved to the modified lithotomy position with his perineum perpendicular to the floor. His perineum and genitalia were then sterilely prepped. An official timeout was then performed. A 16 French Foley catheter was then placed in the bladder and filled with dilute contrast, a rectal tube was placed in the rectum and the transrectal ultrasound probe was placed in the rectum and affixed to the stand. He was then sterilely draped.  Real time ultrasonography was used along with the seed planning software. This was used to develop the seed plan including the number of needles as well as number of seeds required for complete and adequate coverage. Real-time ultrasonography was then used along with the previously developed plan and the Nucletron device to implant a total of 54 seeds using 16 needles. This proceeded without difficulty or complication.   I then proceeded with placement of SpaceOAR by introducing a needle with the bevel angled inferiorly approximately 2 cm superior to the anus. This was angled downward and under direct ultrasound was placed within the space between the prostatic capsule and rectum. This was confirmed with a small amount of sterile saline injected and this was performed under direct ultrasound. I then attached the SpaceOAR to the needle and injected this in the space between the prostate and rectum with good placement noted.  A Foley catheter was then removed as  well as the transrectal ultrasound probe and rectal probe. Flexible cystoscopy was then performed using the 16 French flexible scope which revealed a slightly stenotic wide caliber penile and bulbar urethral narrowing each short segment. The prostatic urethra revealed bilobar hypertrophy but no evidence of obstruction, seeds, spacers or lesions. The bladder was then entered and fully and systematically inspected. The ureteral orifices were noted to be of normal configuration and position. The mucosa revealed no evidence of tumors. There were also no stones identified within the bladder. I noted no seeds or spacers on the floor of the bladder and retroflexion of the scope revealed no seeds protruding from the base of the prostate.  The cystoscope was then removed and the patient was awakened and taken to recovery room in stable and satisfactory condition. He tolerated procedure well and there were no intraoperative complications.   Matt R. Shalunda Lindh MD [facility]  Pager: [phone]

## 2024-08-18 NOTE — Discharge Instructions (Signed)
   Activity:  You are encouraged to ambulate frequently (about every hour during waking hours) to help prevent blood clots from forming in your legs or lungs.  However, you should not engage in any heavy lifting (> 10-15 lbs), strenuous activity, or straining.   Diet: You should advance your diet as instructed by your physician.  It will be normal to have some bloating, nausea, and abdominal discomfort intermittently.   Prescriptions:  You will be provided a prescription for pain medication to take as needed.  If your pain is not severe enough to require the prescription pain medication, you may take extra strength Tylenol instead which will have less side effects.  You should also take a prescribed stool softener to avoid straining with bowel movements as the prescription pain medication may constipate you.   Incisions: You may remove your dressing bandages 48 hours after surgery if not removed in the hospital.  You will either have some small staples or special tissue glue at each of the incision sites. Once the bandages are removed (if present), the incisions may stay open to air.  You may start showering (but not soaking or bathing in water) the 2nd day after surgery and the incisions simply need to be patted dry after the shower.  No additional care is needed.   What to call us about: You should call the office [phone]) if you develop fever > 101 or develop persistent vomiting. Activity:  You are encouraged to ambulate frequently (about every hour during waking hours) to help prevent blood clots from forming in your legs or lungs.  However, you should not engage in any heavy lifting (> 10-15 lbs), strenuous activity, or straining.

## 2024-08-19 ENCOUNTER — Encounter (HOSPITAL_COMMUNITY): Payer: Self-pay | Admitting: Urology

## 2024-08-19 ENCOUNTER — Other Ambulatory Visit: Payer: Self-pay | Admitting: Urology

## 2024-08-19 DIAGNOSIS — C61 Malignant neoplasm of prostate: Secondary | ICD-10-CM

## 2024-08-19 NOTE — Anesthesia Postprocedure Evaluation (Signed)
 Anesthesia Post Note  Patient: Jeffrey Banks  Procedure(s) Performed: INSERTION, RADIATION SOURCE, PROSTATE INJECTION, HYDROGEL SPACER     Patient location during evaluation: PACU Anesthesia Type: General Level of consciousness: awake Pain management: pain level controlled Vital Signs Assessment: post-procedure vital signs reviewed and stable Respiratory status: spontaneous breathing Cardiovascular status: blood pressure returned to baseline Postop Assessment: no apparent nausea or vomiting Anesthetic complications: no   No notable events documented.  Last Vitals:  Vitals:   08/18/24 1528 08/18/24 1534  BP: (!) 194/107 (!) 170/72  Pulse: 60 (!) 48  Resp: 16   Temp: 36.9 C   SpO2: 98% 99%    Last Pain:  Vitals:   08/18/24 1534  TempSrc:   PainSc: 0-No pain                 Lauraine KATHEE [place]

## 2024-08-21 NOTE — Progress Notes (Signed)
 Jeffrey Banks                                          MRN: [id number]   08/21/2024   The [facility] Quality Team Specialist reviewed this patient medical record for the purposes of chart review for care gap closure. The following were reviewed: abstraction for care gap closure-controlling blood pressure and kidney health evaluation for diabetes:uACR.    [facility] Quality Team

## 2024-09-01 ENCOUNTER — Telehealth: Payer: Self-pay

## 2024-09-01 ENCOUNTER — Telehealth: Payer: Self-pay | Admitting: *Deleted

## 2024-09-01 NOTE — Telephone Encounter (Signed)
 Called patient to remind of sim for 09-02-24- arrival time- 8:15 am @ [facility], and his MRI for 09-02-24- arrival time- 10:15 am, spoke with patient and he is aware of these appts. and the instructions

## 2024-09-01 NOTE — Telephone Encounter (Signed)
 RN called in script for Xanax  0.5 mg po 30-60 minutes before MRI, disp #3 no refills per Dr. Patrcia.  Mr. Hemann was notified of prescription sent to his pharmacy for preparation of his MRI on 09/02/2024.  Mr. Krol was also told that for prostate MRI scans go in feet first, so head won't be in the tunnel.  Mr. Hoffer was appreciative of the call.

## 2024-09-02 ENCOUNTER — Ambulatory Visit (HOSPITAL_COMMUNITY)
Admission: RE | Admit: 2024-09-02 | Discharge: 2024-09-02 | Disposition: A | Discharge status: Home / Self Care | Source: Ambulatory Visit | Attending: Urology | Admitting: Urology

## 2024-09-02 ENCOUNTER — Ambulatory Visit
Admission: RE | Admit: 2024-09-02 | Discharge: 2024-09-02 | Disposition: A | Discharge status: Home / Self Care | Source: Ambulatory Visit | Attending: Radiation Oncology | Admitting: Radiation Oncology

## 2024-09-02 DIAGNOSIS — C61 Malignant neoplasm of prostate: Secondary | ICD-10-CM | POA: Diagnosis not present

## 2024-09-03 NOTE — Progress Notes (Signed)
  Radiation Oncology         [phone] ________________________________  Name: Jeffrey Banks MRN: [id number]  Date: 09/02/2024  DOB: [birth year]  COMPLEX SIMULATION NOTE  NARRATIVE:  The patient was brought to the CT Simulation planning suite today following prostate seed implantation approximately one month ago.  Identity was confirmed.  All relevant records and images related to the planned course of therapy were reviewed.  Then, the patient was set-up supine.  CT images were obtained.  The CT images were loaded into the planning software.  Then the prostate and rectum were contoured.  Treatment planning then occurred.  The implanted iodine 125 seeds were identified by the physics staff for projection of radiation distribution  I have requested : 3D Simulation  I have requested a DVH of the following structures: Prostate and rectum.    ________________________________  Donnice FELIX Patrcia, M.D.

## 2024-09-03 NOTE — Progress Notes (Signed)
  Radiation Oncology         [phone] ________________________________  Name: Jeffrey Banks MRN: [id number]  Date: 09/02/2024  DOB: 01/26/1951  SIMULATION AND TREATMENT PLANNING NOTE    ICD-10-CM   1. Malignant neoplasm of prostate (HCC)  C61       DIAGNOSIS:  73 y.o. 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   NARRATIVE:  The patient was brought to the CT Simulation planning suite.  Identity was confirmed.  All relevant records and images related to the planned course of therapy were reviewed.  The patient freely provided informed written consent to proceed with treatment after reviewing the details related to the planned course of therapy. The consent form was witnessed and verified by the simulation staff.  Then, the patient was set-up in a stable reproducible supine position for radiation therapy.  A vacuum lock pillow device was custom fabricated to position his legs in a reproducible immobilized position.  Then, I performed a urethrogram under sterile conditions to identify the prostatic apex.  CT images were obtained.  Surface markings were placed.  The CT images were loaded into the planning software.  Then the prostate target and avoidance structures including the rectum, bladder, bowel and hips were contoured.  Treatment planning then occurred.  The radiation prescription was entered and confirmed.  A total of one complex treatment devices were fabricated. I have requested : Intensity Modulated Radiotherapy (IMRT) is medically necessary for this case for the following reason:  Rectal sparing.SABRA  PLAN:  The patient will receive 45 Gy in 25 fractions of 1.8 Gy, to supplement an up-front prostate seed implant boost of 110 Gy to achieve a total nominal dose of 155 Gy.  ________________________________  Jeffrey Banks Patrcia, M.D.

## 2024-09-05 ENCOUNTER — Encounter: Payer: Self-pay | Admitting: Radiation Oncology

## 2024-09-05 DIAGNOSIS — Z191 Hormone sensitive malignancy status: Secondary | ICD-10-CM | POA: Diagnosis not present

## 2024-09-05 DIAGNOSIS — C61 Malignant neoplasm of prostate: Secondary | ICD-10-CM | POA: Diagnosis not present

## 2024-09-08 NOTE — Progress Notes (Signed)
  Radiation Oncology         [phone] ________________________________  Name: CLEMENS LACHMAN MRN: [id number]  Date: 09/05/2024  DOB: 10-20-1950  3D Planning Note   Prostate Brachytherapy Post-Implant Dosimetry  Diagnosis: 73 y.o. 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   Narrative: On a previous date, ROD MAJERUS returned following prostate seed implantation for post implant planning. He underwent CT scan complex simulation to delineate the three-dimensional structures of the pelvis and demonstrate the radiation distribution.  Since that time, the seed localization, and complex isodose planning with dose volume histograms have now been completed.  Results:   Prostate Coverage - The dose of radiation delivered to the 90% or more of the prostate gland (D90) was 112.03% of the prescription dose. This exceeds our goal of greater than 90%. Rectal Sparing - The volume of rectal tissue receiving the prescription dose or higher was 0.0 cc. This falls under our thresholds tolerance of 1.0 cc.  Impression: The prostate seed implant appears to show adequate target coverage and appropriate rectal sparing.  Plan:  The patient will continue to follow with urology for ongoing PSA determinations. I would anticipate a high likelihood for local tumor control with minimal risk for rectal morbidity.  ________________________________  Donnice FELIX Patrcia, M.D.

## 2024-09-09 DIAGNOSIS — C61 Malignant neoplasm of prostate: Secondary | ICD-10-CM | POA: Diagnosis not present

## 2024-09-15 ENCOUNTER — Ambulatory Visit
Admission: RE | Admit: 2024-09-15 | Discharge: 2024-09-15 | Disposition: A | Discharge status: Home / Self Care | Source: Ambulatory Visit | Attending: Radiation Oncology | Admitting: Radiation Oncology

## 2024-09-15 ENCOUNTER — Other Ambulatory Visit: Payer: Self-pay

## 2024-09-15 DIAGNOSIS — C61 Malignant neoplasm of prostate: Secondary | ICD-10-CM | POA: Diagnosis present

## 2024-09-15 LAB — RAD ONC ARIA SESSION SUMMARY
Course Elapsed Days: 0
Plan Fractions Treated to Date: 1
Plan Prescribed Dose Per Fraction: 1.8 Gy
Plan Total Fractions Prescribed: 25
Plan Total Prescribed Dose: 45 Gy
Reference Point Dosage Given to Date: 1.8 Gy
Reference Point Session Dosage Given: 1.8 Gy
Session Number: 1

## 2024-09-16 ENCOUNTER — Other Ambulatory Visit: Payer: Self-pay

## 2024-09-16 ENCOUNTER — Ambulatory Visit
Admission: RE | Admit: 2024-09-16 | Discharge: 2024-09-16 | Disposition: A | Source: Ambulatory Visit | Attending: Radiation Oncology

## 2024-09-16 DIAGNOSIS — C61 Malignant neoplasm of prostate: Secondary | ICD-10-CM | POA: Diagnosis not present

## 2024-09-16 LAB — RAD ONC ARIA SESSION SUMMARY
Course Elapsed Days: 1
Plan Fractions Treated to Date: 2
Plan Prescribed Dose Per Fraction: 1.8 Gy
Plan Total Fractions Prescribed: 25
Plan Total Prescribed Dose: 45 Gy
Reference Point Dosage Given to Date: 3.6 Gy
Reference Point Session Dosage Given: 1.8 Gy
Session Number: 2

## 2024-09-17 ENCOUNTER — Other Ambulatory Visit: Payer: Self-pay

## 2024-09-17 ENCOUNTER — Ambulatory Visit
Admission: RE | Admit: 2024-09-17 | Discharge: 2024-09-17 | Disposition: A | Source: Ambulatory Visit | Attending: Radiation Oncology

## 2024-09-17 DIAGNOSIS — C61 Malignant neoplasm of prostate: Secondary | ICD-10-CM | POA: Diagnosis not present

## 2024-09-17 LAB — RAD ONC ARIA SESSION SUMMARY
Course Elapsed Days: 2
Plan Fractions Treated to Date: 3
Plan Prescribed Dose Per Fraction: 1.8 Gy
Plan Total Fractions Prescribed: 25
Plan Total Prescribed Dose: 45 Gy
Reference Point Dosage Given to Date: 5.4 Gy
Reference Point Session Dosage Given: 1.8 Gy
Session Number: 3

## 2024-09-18 ENCOUNTER — Ambulatory Visit
Admission: RE | Admit: 2024-09-18 | Discharge: 2024-09-18 | Disposition: A | Source: Ambulatory Visit | Attending: Radiation Oncology

## 2024-09-18 ENCOUNTER — Other Ambulatory Visit: Payer: Self-pay

## 2024-09-18 DIAGNOSIS — C61 Malignant neoplasm of prostate: Secondary | ICD-10-CM | POA: Diagnosis not present

## 2024-09-18 LAB — RAD ONC ARIA SESSION SUMMARY
Course Elapsed Days: 3
Plan Fractions Treated to Date: 4
Plan Prescribed Dose Per Fraction: 1.8 Gy
Plan Total Fractions Prescribed: 25
Plan Total Prescribed Dose: 45 Gy
Reference Point Dosage Given to Date: 7.2 Gy
Reference Point Session Dosage Given: 1.8 Gy
Session Number: 4

## 2024-09-19 ENCOUNTER — Other Ambulatory Visit: Payer: Self-pay

## 2024-09-19 ENCOUNTER — Ambulatory Visit
Admission: RE | Admit: 2024-09-19 | Discharge: 2024-09-19 | Disposition: A | Source: Ambulatory Visit | Attending: Radiation Oncology

## 2024-09-19 DIAGNOSIS — C61 Malignant neoplasm of prostate: Secondary | ICD-10-CM | POA: Diagnosis not present

## 2024-09-19 LAB — RAD ONC ARIA SESSION SUMMARY
Course Elapsed Days: 4
Plan Fractions Treated to Date: 5
Plan Prescribed Dose Per Fraction: 1.8 Gy
Plan Total Fractions Prescribed: 25
Plan Total Prescribed Dose: 45 Gy
Reference Point Dosage Given to Date: 9 Gy
Reference Point Session Dosage Given: 1.8 Gy
Session Number: 5

## 2024-09-22 ENCOUNTER — Ambulatory Visit
Admission: RE | Admit: 2024-09-22 | Discharge: 2024-09-22 | Disposition: A | Source: Ambulatory Visit | Attending: Radiation Oncology

## 2024-09-22 ENCOUNTER — Other Ambulatory Visit: Payer: Self-pay

## 2024-09-22 DIAGNOSIS — C61 Malignant neoplasm of prostate: Secondary | ICD-10-CM | POA: Diagnosis not present

## 2024-09-22 LAB — RAD ONC ARIA SESSION SUMMARY
Course Elapsed Days: 7
Plan Fractions Treated to Date: 6
Plan Prescribed Dose Per Fraction: 1.8 Gy
Plan Total Fractions Prescribed: 25
Plan Total Prescribed Dose: 45 Gy
Reference Point Dosage Given to Date: 10.8 Gy
Reference Point Session Dosage Given: 1.8 Gy
Session Number: 6

## 2024-09-23 ENCOUNTER — Other Ambulatory Visit: Payer: Self-pay

## 2024-09-23 ENCOUNTER — Ambulatory Visit
Admission: RE | Admit: 2024-09-23 | Discharge: 2024-09-23 | Disposition: A | Source: Ambulatory Visit | Attending: Radiation Oncology

## 2024-09-23 DIAGNOSIS — C61 Malignant neoplasm of prostate: Secondary | ICD-10-CM | POA: Diagnosis not present

## 2024-09-23 LAB — RAD ONC ARIA SESSION SUMMARY
Course Elapsed Days: 8
Plan Fractions Treated to Date: 7
Plan Prescribed Dose Per Fraction: 1.8 Gy
Plan Total Fractions Prescribed: 25
Plan Total Prescribed Dose: 45 Gy
Reference Point Dosage Given to Date: 12.6 Gy
Reference Point Session Dosage Given: 1.8 Gy
Session Number: 7

## 2024-09-24 ENCOUNTER — Ambulatory Visit
Admission: RE | Admit: 2024-09-24 | Discharge: 2024-09-24 | Disposition: A | Discharge status: Home / Self Care | Source: Ambulatory Visit | Attending: Radiation Oncology | Admitting: Radiation Oncology

## 2024-09-24 ENCOUNTER — Other Ambulatory Visit: Payer: Self-pay

## 2024-09-24 DIAGNOSIS — C61 Malignant neoplasm of prostate: Secondary | ICD-10-CM | POA: Diagnosis not present

## 2024-09-24 LAB — RAD ONC ARIA SESSION SUMMARY
Course Elapsed Days: 9
Plan Fractions Treated to Date: 8
Plan Prescribed Dose Per Fraction: 1.8 Gy
Plan Total Fractions Prescribed: 25
Plan Total Prescribed Dose: 45 Gy
Reference Point Dosage Given to Date: 14.4 Gy
Reference Point Session Dosage Given: 1.8 Gy
Session Number: 8

## 2024-09-25 ENCOUNTER — Ambulatory Visit: Admission: RE | Admit: 2024-09-25 | Discharge: 2024-09-25 | Attending: Radiation Oncology

## 2024-09-25 ENCOUNTER — Ambulatory Visit
Admission: RE | Admit: 2024-09-25 | Discharge: 2024-09-25 | Disposition: A | Discharge status: Home / Self Care | Source: Ambulatory Visit | Attending: Radiation Oncology | Admitting: Radiation Oncology

## 2024-09-25 ENCOUNTER — Other Ambulatory Visit: Payer: Self-pay

## 2024-09-25 DIAGNOSIS — C61 Malignant neoplasm of prostate: Secondary | ICD-10-CM | POA: Diagnosis not present

## 2024-09-25 LAB — RAD ONC ARIA SESSION SUMMARY
Course Elapsed Days: 10
Plan Fractions Treated to Date: 9
Plan Prescribed Dose Per Fraction: 1.8 Gy
Plan Total Fractions Prescribed: 25
Plan Total Prescribed Dose: 45 Gy
Reference Point Dosage Given to Date: 16.2 Gy
Reference Point Session Dosage Given: 1.8 Gy
Session Number: 9

## 2024-09-26 ENCOUNTER — Ambulatory Visit
Admission: RE | Admit: 2024-09-26 | Discharge: 2024-09-26 | Disposition: A | Source: Ambulatory Visit | Attending: Radiation Oncology

## 2024-09-26 ENCOUNTER — Other Ambulatory Visit: Payer: Self-pay

## 2024-09-26 DIAGNOSIS — C61 Malignant neoplasm of prostate: Secondary | ICD-10-CM | POA: Diagnosis not present

## 2024-09-26 LAB — RAD ONC ARIA SESSION SUMMARY
Course Elapsed Days: 11
Plan Fractions Treated to Date: 10
Plan Prescribed Dose Per Fraction: 1.8 Gy
Plan Total Fractions Prescribed: 25
Plan Total Prescribed Dose: 45 Gy
Reference Point Dosage Given to Date: 18 Gy
Reference Point Session Dosage Given: 1.8 Gy
Session Number: 10

## 2024-09-29 ENCOUNTER — Ambulatory Visit
Admission: RE | Admit: 2024-09-29 | Discharge: 2024-09-29 | Disposition: A | Source: Ambulatory Visit | Attending: Radiation Oncology

## 2024-09-29 ENCOUNTER — Other Ambulatory Visit: Payer: Self-pay

## 2024-09-29 DIAGNOSIS — C61 Malignant neoplasm of prostate: Secondary | ICD-10-CM | POA: Diagnosis not present

## 2024-09-29 LAB — RAD ONC ARIA SESSION SUMMARY
Course Elapsed Days: 14
Plan Fractions Treated to Date: 11
Plan Prescribed Dose Per Fraction: 1.8 Gy
Plan Total Fractions Prescribed: 25
Plan Total Prescribed Dose: 45 Gy
Reference Point Dosage Given to Date: 19.8 Gy
Reference Point Session Dosage Given: 1.8 Gy
Session Number: 11

## 2024-09-30 ENCOUNTER — Other Ambulatory Visit: Payer: Self-pay

## 2024-09-30 ENCOUNTER — Ambulatory Visit
Admission: RE | Admit: 2024-09-30 | Discharge: 2024-09-30 | Disposition: A | Source: Ambulatory Visit | Attending: Radiation Oncology

## 2024-09-30 DIAGNOSIS — C61 Malignant neoplasm of prostate: Secondary | ICD-10-CM | POA: Diagnosis not present

## 2024-09-30 LAB — RAD ONC ARIA SESSION SUMMARY
Course Elapsed Days: 15
Plan Fractions Treated to Date: 12
Plan Prescribed Dose Per Fraction: 1.8 Gy
Plan Total Fractions Prescribed: 25
Plan Total Prescribed Dose: 45 Gy
Reference Point Dosage Given to Date: 21.6 Gy
Reference Point Session Dosage Given: 1.8 Gy
Session Number: 12

## 2024-10-01 ENCOUNTER — Other Ambulatory Visit: Payer: Self-pay

## 2024-10-01 ENCOUNTER — Ambulatory Visit: Admission: RE | Admit: 2024-10-01 | Discharge: 2024-10-01 | Attending: Radiation Oncology

## 2024-10-01 DIAGNOSIS — C61 Malignant neoplasm of prostate: Secondary | ICD-10-CM | POA: Diagnosis not present

## 2024-10-01 LAB — RAD ONC ARIA SESSION SUMMARY
Course Elapsed Days: 16
Plan Fractions Treated to Date: 13
Plan Prescribed Dose Per Fraction: 1.8 Gy
Plan Total Fractions Prescribed: 25
Plan Total Prescribed Dose: 45 Gy
Reference Point Dosage Given to Date: 23.4 Gy
Reference Point Session Dosage Given: 1.8 Gy
Session Number: 13

## 2024-10-02 ENCOUNTER — Ambulatory Visit

## 2024-10-02 ENCOUNTER — Other Ambulatory Visit: Payer: Self-pay

## 2024-10-02 DIAGNOSIS — C61 Malignant neoplasm of prostate: Secondary | ICD-10-CM | POA: Diagnosis not present

## 2024-10-02 LAB — RAD ONC ARIA SESSION SUMMARY
Course Elapsed Days: 17
Plan Fractions Treated to Date: 14
Plan Prescribed Dose Per Fraction: 1.8 Gy
Plan Total Fractions Prescribed: 25
Plan Total Prescribed Dose: 45 Gy
Reference Point Dosage Given to Date: 25.2 Gy
Reference Point Session Dosage Given: 1.8 Gy
Session Number: 14

## 2024-10-03 ENCOUNTER — Ambulatory Visit: Admission: RE | Admit: 2024-10-03 | Discharge: 2024-10-03 | Attending: Radiation Oncology

## 2024-10-03 ENCOUNTER — Other Ambulatory Visit: Payer: Self-pay

## 2024-10-03 DIAGNOSIS — C61 Malignant neoplasm of prostate: Secondary | ICD-10-CM | POA: Diagnosis not present

## 2024-10-03 LAB — RAD ONC ARIA SESSION SUMMARY
Course Elapsed Days: 18
Plan Fractions Treated to Date: 15
Plan Prescribed Dose Per Fraction: 1.8 Gy
Plan Total Fractions Prescribed: 25
Plan Total Prescribed Dose: 45 Gy
Reference Point Dosage Given to Date: 27 Gy
Reference Point Session Dosage Given: 1.8 Gy
Session Number: 15

## 2024-10-05 ENCOUNTER — Other Ambulatory Visit: Payer: Self-pay

## 2024-10-05 ENCOUNTER — Ambulatory Visit: Admission: RE | Admit: 2024-10-05 | Discharge: 2024-10-05 | Attending: Radiation Oncology

## 2024-10-05 DIAGNOSIS — C61 Malignant neoplasm of prostate: Secondary | ICD-10-CM | POA: Diagnosis not present

## 2024-10-05 LAB — RAD ONC ARIA SESSION SUMMARY
Course Elapsed Days: 20
Plan Fractions Treated to Date: 16
Plan Prescribed Dose Per Fraction: 1.8 Gy
Plan Total Fractions Prescribed: 25
Plan Total Prescribed Dose: 45 Gy
Reference Point Dosage Given to Date: 28.8 Gy
Reference Point Session Dosage Given: 1.8 Gy
Session Number: 16

## 2024-10-06 ENCOUNTER — Other Ambulatory Visit: Payer: Self-pay

## 2024-10-06 ENCOUNTER — Ambulatory Visit: Admission: RE | Admit: 2024-10-06 | Discharge: 2024-10-06 | Attending: Radiation Oncology

## 2024-10-06 DIAGNOSIS — C61 Malignant neoplasm of prostate: Secondary | ICD-10-CM | POA: Diagnosis not present

## 2024-10-06 LAB — RAD ONC ARIA SESSION SUMMARY
Course Elapsed Days: 21
Plan Fractions Treated to Date: 17
Plan Prescribed Dose Per Fraction: 1.8 Gy
Plan Total Fractions Prescribed: 25
Plan Total Prescribed Dose: 45 Gy
Reference Point Dosage Given to Date: 30.6 Gy
Reference Point Session Dosage Given: 1.8 Gy
Session Number: 17

## 2024-10-07 ENCOUNTER — Ambulatory Visit
Admission: RE | Admit: 2024-10-07 | Discharge: 2024-10-07 | Disposition: A | Discharge status: Home / Self Care | Source: Ambulatory Visit | Attending: Radiation Oncology | Admitting: Radiation Oncology

## 2024-10-07 ENCOUNTER — Other Ambulatory Visit: Payer: Self-pay

## 2024-10-07 DIAGNOSIS — C61 Malignant neoplasm of prostate: Secondary | ICD-10-CM | POA: Diagnosis not present

## 2024-10-07 LAB — RAD ONC ARIA SESSION SUMMARY
Course Elapsed Days: 22
Plan Fractions Treated to Date: 18
Plan Prescribed Dose Per Fraction: 1.8 Gy
Plan Total Fractions Prescribed: 25
Plan Total Prescribed Dose: 45 Gy
Reference Point Dosage Given to Date: 32.4 Gy
Reference Point Session Dosage Given: 1.8 Gy
Session Number: 18

## 2024-10-08 ENCOUNTER — Ambulatory Visit
Admission: RE | Admit: 2024-10-08 | Discharge: 2024-10-08 | Disposition: A | Source: Ambulatory Visit | Attending: Radiation Oncology

## 2024-10-08 ENCOUNTER — Other Ambulatory Visit: Payer: Self-pay

## 2024-10-08 DIAGNOSIS — C61 Malignant neoplasm of prostate: Secondary | ICD-10-CM | POA: Diagnosis not present

## 2024-10-08 LAB — RAD ONC ARIA SESSION SUMMARY
Course Elapsed Days: 23
Plan Fractions Treated to Date: 19
Plan Prescribed Dose Per Fraction: 1.8 Gy
Plan Total Fractions Prescribed: 25
Plan Total Prescribed Dose: 45 Gy
Reference Point Dosage Given to Date: 34.2 Gy
Reference Point Session Dosage Given: 1.8 Gy
Session Number: 19

## 2024-10-13 ENCOUNTER — Ambulatory Visit
Admission: RE | Admit: 2024-10-13 | Discharge: 2024-10-13 | Disposition: A | Discharge status: Home / Self Care | Source: Ambulatory Visit | Attending: Radiation Oncology | Admitting: Radiation Oncology

## 2024-10-13 ENCOUNTER — Other Ambulatory Visit: Payer: Self-pay

## 2024-10-13 DIAGNOSIS — C61 Malignant neoplasm of prostate: Secondary | ICD-10-CM | POA: Diagnosis not present

## 2024-10-13 LAB — RAD ONC ARIA SESSION SUMMARY
Course Elapsed Days: 28
Plan Fractions Treated to Date: 20
Plan Prescribed Dose Per Fraction: 1.8 Gy
Plan Total Fractions Prescribed: 25
Plan Total Prescribed Dose: 45 Gy
Reference Point Dosage Given to Date: 36 Gy
Reference Point Session Dosage Given: 1.8 Gy
Session Number: 20

## 2024-10-14 ENCOUNTER — Other Ambulatory Visit: Payer: Self-pay

## 2024-10-14 ENCOUNTER — Ambulatory Visit
Admission: RE | Admit: 2024-10-14 | Discharge: 2024-10-14 | Disposition: A | Discharge status: Home / Self Care | Source: Ambulatory Visit | Attending: Radiation Oncology | Admitting: Radiation Oncology

## 2024-10-14 DIAGNOSIS — C61 Malignant neoplasm of prostate: Secondary | ICD-10-CM | POA: Diagnosis not present

## 2024-10-14 LAB — RAD ONC ARIA SESSION SUMMARY
Course Elapsed Days: 29
Plan Fractions Treated to Date: 21
Plan Prescribed Dose Per Fraction: 1.8 Gy
Plan Total Fractions Prescribed: 25
Plan Total Prescribed Dose: 45 Gy
Reference Point Dosage Given to Date: 37.8 Gy
Reference Point Session Dosage Given: 1.8 Gy
Session Number: 21

## 2024-10-15 ENCOUNTER — Ambulatory Visit
Admission: RE | Admit: 2024-10-15 | Discharge: 2024-10-15 | Disposition: A | Discharge status: Home / Self Care | Source: Ambulatory Visit | Attending: Radiation Oncology | Admitting: Radiation Oncology

## 2024-10-15 ENCOUNTER — Other Ambulatory Visit: Payer: Self-pay

## 2024-10-15 DIAGNOSIS — C61 Malignant neoplasm of prostate: Secondary | ICD-10-CM | POA: Diagnosis not present

## 2024-10-15 LAB — RAD ONC ARIA SESSION SUMMARY
Course Elapsed Days: 30
Plan Fractions Treated to Date: 22
Plan Prescribed Dose Per Fraction: 1.8 Gy
Plan Total Fractions Prescribed: 25
Plan Total Prescribed Dose: 45 Gy
Reference Point Dosage Given to Date: 39.6 Gy
Reference Point Session Dosage Given: 1.8 Gy
Session Number: 22

## 2024-10-17 ENCOUNTER — Ambulatory Visit
Admission: RE | Admit: 2024-10-17 | Discharge: 2024-10-17 | Disposition: A | Discharge status: Home / Self Care | Source: Ambulatory Visit | Attending: Radiation Oncology | Admitting: Radiation Oncology

## 2024-10-17 ENCOUNTER — Other Ambulatory Visit: Payer: Self-pay

## 2024-10-17 DIAGNOSIS — C61 Malignant neoplasm of prostate: Secondary | ICD-10-CM | POA: Insufficient documentation

## 2024-10-17 LAB — RAD ONC ARIA SESSION SUMMARY
Course Elapsed Days: 32
Plan Fractions Treated to Date: 23
Plan Prescribed Dose Per Fraction: 1.8 Gy
Plan Total Fractions Prescribed: 25
Plan Total Prescribed Dose: 45 Gy
Reference Point Dosage Given to Date: 41.4 Gy
Reference Point Session Dosage Given: 1.8 Gy
Session Number: 23

## 2024-10-20 ENCOUNTER — Ambulatory Visit
Admission: RE | Admit: 2024-10-20 | Discharge: 2024-10-20 | Disposition: A | Discharge status: Home / Self Care | Source: Ambulatory Visit | Attending: Radiation Oncology | Admitting: Radiation Oncology

## 2024-10-20 ENCOUNTER — Other Ambulatory Visit: Payer: Self-pay

## 2024-10-20 DIAGNOSIS — C61 Malignant neoplasm of prostate: Secondary | ICD-10-CM | POA: Diagnosis not present

## 2024-10-20 LAB — RAD ONC ARIA SESSION SUMMARY
Course Elapsed Days: 35
Plan Fractions Treated to Date: 24
Plan Prescribed Dose Per Fraction: 1.8 Gy
Plan Total Fractions Prescribed: 25
Plan Total Prescribed Dose: 45 Gy
Reference Point Dosage Given to Date: 43.2 Gy
Reference Point Session Dosage Given: 1.8 Gy
Session Number: 24

## 2024-10-21 ENCOUNTER — Other Ambulatory Visit: Payer: Self-pay

## 2024-10-21 ENCOUNTER — Ambulatory Visit
Admission: RE | Admit: 2024-10-21 | Discharge: 2024-10-21 | Disposition: A | Source: Ambulatory Visit | Attending: Radiation Oncology

## 2024-10-21 ENCOUNTER — Ambulatory Visit

## 2024-10-21 DIAGNOSIS — C61 Malignant neoplasm of prostate: Secondary | ICD-10-CM | POA: Diagnosis not present

## 2024-10-21 LAB — RAD ONC ARIA SESSION SUMMARY
Course Elapsed Days: 36
Plan Fractions Treated to Date: 25
Plan Prescribed Dose Per Fraction: 1.8 Gy
Plan Total Fractions Prescribed: 25
Plan Total Prescribed Dose: 45 Gy
Reference Point Dosage Given to Date: 45 Gy
Reference Point Session Dosage Given: 1.8 Gy
Session Number: 25

## 2024-10-22 ENCOUNTER — Ambulatory Visit

## 2024-10-22 NOTE — Radiation Completion Notes (Signed)
 Patient Name: Jeffrey Banks, ROSENGRANT MRN: [id number] Date of Birth: [birth year] Referring Physician: DONNICE SIAD, M.D. Date of Service: 2024-10-22 Radiation Oncologist: Adina Barge, M.D. [facility]                             RADIATION ONCOLOGY END OF TREATMENT NOTE     Diagnosis: C61 Malignant neoplasm of prostate Staging on 2024-05-13: Malignant neoplasm of prostate (HCC) T=cT1c, N=cN0, M=cM0 Intent: Curative     ==========DELIVERED PLANS==========  First Treatment Date: 2024-09-15 Last Treatment Date: 2024-10-21   Plan Name: Prostate Site: Prostate Technique: IMRT Mode: Photon Dose Per Fraction: 1.8 Gy Prescribed Dose (Delivered / Prescribed): 45 Gy / 45 Gy Prescribed Fxs (Delivered / Prescribed): 25 / 25   Plan Name: Prostate Seed Implant Site: Prostate Technique: Radioactive Seed Implant I-125 Mode: Brachytherapy Dose Per Fraction: 110 Gy Prescribed Dose (Delivered / Prescribed): 110 Gy / 110 Gy Prescribed Fxs (Delivered / Prescribed): 1 / 1     ==========ON TREATMENT VISIT DATES========== 2024-09-19, 2024-09-25, 2024-10-03, 2024-10-08, 2024-10-17     ==========UPCOMING VISITS========== 11/18/2024 [facility]-RADIATION ONC POST TREATMENT CALL [facility]POST TREAT[facility]        ==========APPENDIX - ON TREATMENT VISIT NOTES==========   See weekly On Treatment Notes in Epic for details in the Media tab (listed as Progress notes on the On Treatment Visit Dates listed above).

## 2024-10-27 NOTE — Progress Notes (Signed)
 JEPTHA HINNENKAMP                                          MRN: [id number]   10/27/2024   The VBCI Quality Team Specialist reviewed this patient medical record for the purposes of chart review for care gap closure. The following were reviewed: chart review for care gap closure-controlling blood pressure.    [facility]BCI Quality Team

## 2024-11-03 NOTE — Progress Notes (Signed)
 Patient was a RadOnc Consult on 06/13/24 for his stage T2a adenocarcinoma of the prostate with Gleason score of 4+4, and PSA of 5. Patient proceed with treatment recommendations of ADT, brachy boost, and 5 weeks of IMRT and had his final radiation treatment on 10/21/24.   Patient is scheduled for a post treatment nurse call on 11/18/24 and has active follow up's with urology.  Next med dispense 11/11/24.

## 2024-11-04 NOTE — Progress Notes (Signed)
" °  Radiation Oncology         [phone] ________________________________  Name: Jeffrey Banks MRN: [id number]  Date of Service: 11/18/2024  DOB: [birth year]  Post Treatment Telephone Note  Diagnosis:  Malignant neoplasm of prostate   First Treatment Date: 2024-09-15 Last Treatment Date: 2024-10-21   Plan Name: Prostate Site: Prostate Technique: IMRT Mode: Photon Dose Per Fraction: 1.8 Gy Prescribed Dose (Delivered / Prescribed): 45 Gy / 45 Gy Prescribed Fxs (Delivered / Prescribed): 25 / 25   Plan Name: Prostate Seed Implant Site: Prostate Technique: Radioactive Seed Implant I-125 Mode: Brachytherapy Dose Per Fraction: 110 Gy Prescribed Dose (Delivered / Prescribed): 110 Gy / 110 Gy Prescribed Fxs (Delivered / Prescribed): 1 / 1  Pre Treatment IPSS Score: 8 (as documented in the provider consult note)  The patient was available for call today.   Symptoms of fatigue have improved since completing therapy.  Symptoms of bladder changes have improved since completing therapy. Current symptoms include some frequency and urgency, and medications for bladder symptoms include: none.  Symptoms of bowel changes have improved since completing therapy. Current symptoms include N/A, and medications for bowel symptoms include N/A.   Post Treatment IPSS Score: IPSS Questionnaire (AUA-7): Over the past month   1)  How often have you had a sensation of not emptying your bladder completely after you finish urinating?  1 - Less than 1 time in 5  2)  How often have you had to urinate again less than two hours after you finished urinating? 1 - Less than 1 time in 5  3)  How often have you found you stopped and started again several times when you urinated?  2 - Less than half the time  4) How difficult have you found it to postpone urination?  3 - About half the time  5) How often have you had a weak urinary stream?  3 - About half the time  6) How often have you had to push or strain to  begin urination?  0 - Not at all  7) How many times did you most typically get up to urinate from the time you went to bed until the time you got up in the morning?  2 - 2 times  Total score:  12. Which indicates moderate symptoms  0-7 mildly symptomatic   8-19 moderately symptomatic   20-35 severely symptomatic   Patient does not currently have any scheduled follow up with his urologist to his knowledge so he was advised to call [facility] to schedule his post-treatment follow up with [name] for ongoing surveillance.He did however, have a PSA at his PCP a week after he finished treatment and it was 0.47.  He was counseled that PSA levels will be drawn in the urology office, and was reassured that additional time is expected to improve bowel and bladder symptoms. He was encouraged to call back with concerns or questions regarding radiation. Patient did want to note how pleased he was with his treatment and everyone involved. He really appreciated everyone's kindness. "

## 2024-11-18 ENCOUNTER — Ambulatory Visit
Admission: RE | Admit: 2024-11-18 | Discharge: 2024-11-18 | Disposition: A | Source: Ambulatory Visit | Attending: Radiation Oncology

## 2024-11-18 DIAGNOSIS — C61 Malignant neoplasm of prostate: Secondary | ICD-10-CM
